# Patient Record
Sex: Female | Born: 1985 | Race: White | Hispanic: No | Marital: Married | Smoking: Never smoker
Health system: Southern US, Community
[De-identification: ages and names within clinical notes are randomized; demographics above are authoritative.]

## PROBLEM LIST (undated history)

## (undated) ENCOUNTER — Inpatient Hospital Stay (HOSPITAL_COMMUNITY): Payer: Self-pay

## (undated) DIAGNOSIS — R12 Heartburn: Secondary | ICD-10-CM

## (undated) DIAGNOSIS — N200 Calculus of kidney: Secondary | ICD-10-CM

## (undated) DIAGNOSIS — O26899 Other specified pregnancy related conditions, unspecified trimester: Secondary | ICD-10-CM

## (undated) HISTORY — PX: OTHER SURGICAL HISTORY: SHX169

## (undated) HISTORY — PX: ESOPHAGOGASTRODUODENOSCOPY ENDOSCOPY: SHX5814

---

## 2010-05-21 HISTORY — PX: WISDOM TOOTH EXTRACTION: SHX21

## 2011-10-08 ENCOUNTER — Inpatient Hospital Stay: Payer: Self-pay

## 2011-10-08 LAB — CBC WITH DIFFERENTIAL/PLATELET
Eosinophil #: 0.1 10*3/uL (ref 0.0–0.7)
Eosinophil %: 1.3 %
HCT: 36.9 % (ref 35.0–47.0)
HGB: 13.2 g/dL (ref 12.0–16.0)
Lymphocyte #: 1.9 10*3/uL (ref 1.0–3.6)
Lymphocyte %: 18.9 %
MCH: 36.2 pg — ABNORMAL HIGH (ref 26.0–34.0)
MCHC: 35.9 g/dL (ref 32.0–36.0)
MCV: 101 fL — ABNORMAL HIGH (ref 80–100)
Neutrophil #: 7.3 10*3/uL — ABNORMAL HIGH (ref 1.4–6.5)
Neutrophil %: 73 %
Platelet: 142 10*3/uL — ABNORMAL LOW (ref 150–440)
RDW: 12.9 % (ref 11.5–14.5)
WBC: 10 10*3/uL (ref 3.6–11.0)

## 2011-10-11 LAB — HEMATOCRIT: HCT: 26.1 % — ABNORMAL LOW (ref 35.0–47.0)

## 2012-03-26 ENCOUNTER — Emergency Department: Payer: Self-pay | Admitting: Emergency Medicine

## 2012-03-26 LAB — URINALYSIS, COMPLETE
Nitrite: NEGATIVE
Ph: 6 (ref 4.5–8.0)
Protein: 100
Specific Gravity: 1.027 (ref 1.003–1.030)
WBC UR: 3 /HPF (ref 0–5)

## 2012-03-26 LAB — CBC
HCT: 38.8 % (ref 35.0–47.0)
HGB: 13.9 g/dL (ref 12.0–16.0)
MCHC: 35.8 g/dL (ref 32.0–36.0)
RDW: 12.8 % (ref 11.5–14.5)
WBC: 15 10*3/uL — ABNORMAL HIGH (ref 3.6–11.0)

## 2012-03-26 LAB — BASIC METABOLIC PANEL
Chloride: 104 mmol/L (ref 98–107)
Creatinine: 0.79 mg/dL (ref 0.60–1.30)
EGFR (African American): >60
EGFR (Non-African Amer.): >60
Potassium: 3.6 mmol/L (ref 3.5–5.1)
Sodium: 139 mmol/L (ref 136–145)

## 2012-07-11 LAB — OB RESULTS CONSOLE RUBELLA ANTIBODY, IGM: Rubella: IMMUNE

## 2012-07-11 LAB — OB RESULTS CONSOLE HIV ANTIBODY (ROUTINE TESTING): HIV: NONREACTIVE

## 2012-07-11 LAB — OB RESULTS CONSOLE ABO/RH: RH Type: NEGATIVE

## 2012-08-20 ENCOUNTER — Inpatient Hospital Stay (HOSPITAL_COMMUNITY)
Admission: AD | Admit: 2012-08-20 | Discharge: 2012-08-20 | Disposition: A | Discharge status: Home / Self Care | Payer: 59 | Source: Ambulatory Visit | Attending: Obstetrics and Gynecology | Admitting: Obstetrics and Gynecology

## 2012-08-20 ENCOUNTER — Inpatient Hospital Stay (HOSPITAL_COMMUNITY): Payer: 59

## 2012-08-20 ENCOUNTER — Encounter (HOSPITAL_COMMUNITY): Payer: Self-pay | Admitting: *Deleted

## 2012-08-20 DIAGNOSIS — O321XX0 Maternal care for breech presentation, not applicable or unspecified: Secondary | ICD-10-CM | POA: Insufficient documentation

## 2012-08-20 DIAGNOSIS — O26859 Spotting complicating pregnancy, unspecified trimester: Secondary | ICD-10-CM | POA: Insufficient documentation

## 2012-08-20 DIAGNOSIS — Z2989 Encounter for other specified prophylactic measures: Secondary | ICD-10-CM | POA: Insufficient documentation

## 2012-08-20 DIAGNOSIS — Z298 Encounter for other specified prophylactic measures: Secondary | ICD-10-CM | POA: Insufficient documentation

## 2012-08-20 HISTORY — DX: Calculus of kidney: N20.0

## 2012-08-20 LAB — URINALYSIS, ROUTINE W REFLEX MICROSCOPIC
Bilirubin Urine: NEGATIVE
Glucose, UA: NEGATIVE mg/dL
Nitrite: NEGATIVE
Specific Gravity, Urine: 1.01 (ref 1.005–1.030)
pH: 7 (ref 5.0–8.0)

## 2012-08-20 LAB — URINE MICROSCOPIC-ADD ON

## 2012-08-20 MED ORDER — RHO D IMMUNE GLOBULIN 1500 UNIT/2ML IJ SOLN
300.0000 ug | Freq: Once | INTRAMUSCULAR | Status: AC
  Administered 2012-08-20: 300 ug via INTRAMUSCULAR
  Filled 2012-08-20: qty 2

## 2012-08-20 NOTE — MAU Note (Signed)
Pt given information on rhophylac
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
## 2012-08-20 NOTE — MAU Note (Signed)
Pt reports spotting this am. Denies pain.

## 2012-08-21 LAB — RH IG WORKUP (INCLUDES ABO/RH)
ABO/RH(D): A NEG
Antibody Screen: NEGATIVE
Gestational Age(Wks): 14.1

## 2012-10-29 LAB — PATHOLOGY REPORT

## 2012-12-07 LAB — OB RESULTS CONSOLE RPR: RPR: NONREACTIVE

## 2013-01-14 ENCOUNTER — Other Ambulatory Visit: Payer: Self-pay | Admitting: Obstetrics and Gynecology

## 2013-01-22 ENCOUNTER — Encounter (HOSPITAL_COMMUNITY): Payer: Self-pay | Admitting: Pharmacist

## 2013-02-06 ENCOUNTER — Encounter (HOSPITAL_COMMUNITY): Payer: Self-pay

## 2013-02-09 ENCOUNTER — Encounter (HOSPITAL_COMMUNITY): Payer: Self-pay

## 2013-02-09 ENCOUNTER — Encounter (HOSPITAL_COMMUNITY)
Admission: RE | Admit: 2013-02-09 | Discharge: 2013-02-09 | Disposition: A | Discharge status: Home / Self Care | Payer: 59 | Source: Ambulatory Visit | Attending: Obstetrics and Gynecology | Admitting: Obstetrics and Gynecology

## 2013-02-09 HISTORY — DX: Other specified pregnancy related conditions, unspecified trimester: O26.899

## 2013-02-09 HISTORY — DX: Heartburn: R12

## 2013-02-09 LAB — CBC
Hemoglobin: 13 g/dL (ref 12.0–15.0)
MCH: 34.5 pg — ABNORMAL HIGH (ref 26.0–34.0)
MCHC: 35 g/dL (ref 30.0–36.0)
RDW: 13.3 % (ref 11.5–15.5)

## 2013-02-09 LAB — RPR: RPR Ser Ql: NONREACTIVE

## 2013-02-09 NOTE — Pre-Procedure Instructions (Signed)
Informed Dr Brayton Caves of patient's low platelet count of 123 at PAT appt.  Draw STAT platelet count on DOS.  Verified Order.

## 2013-02-09 NOTE — H&P (Signed)
NAMESEKAI, NAYAK NO.:  [id number]  MEDICAL RECORD NO.:  [id number]  LOCATION:  [facility]                           FACILITY:  [facility]  PHYSICIAN:  Lenoard Aden, M.D.DATE OF BIRTH:  03-21-1986  DATE OF ADMISSION:  02/10/2013 DATE OF DISCHARGE:  02/13/2013                             HISTORY & PHYSICAL   CHIEF COMPLAINT:  Previous C-section for repeat.  HISTORY OF PRESENT ILLNESS:  She is a 27 year old white female G2, P0, who presents now at 76 weeks with previous C-section for repeat cesarean section.  MEDICATIONS:  To include prenatal vitamins.  ALLERGIES:  She has no known drug allergies.  SOCIAL HISTORY:  She is a nonsmoker, nondrinker.  Denies domestic or physical violence.  Her previous obstetric history is remarkable for a C-section for a fetal distress in 2013 of a 9 pounds 5 ounce fetus which due to difficulties at time of C-section suffered from a neonatal demise.  FAMILY HISTORY:  She has a family history of cleft palate, cervical cancer, and breast cancer.  Prenatal course has been uncomplicated to date.  PREVIOUS SURGICAL HISTORY:  Remarkable for C-section as noted.  PHYSICAL EXAMINATION:  GENERAL:  She is a well-developed, well- nourished, white female, in no acute distress. HEENT:  Normal. NECK:  Supple.  Full range of motion. LUNGS:  Clear. HEART:  Regular rhythm. ABDOMEN:  Soft, gravid, nontender.  Estimated fetal weight 8 pounds. Cervix is closed, 60%, vertex, -1. EXTREMITIES:  There are no cords. NEUROLOGIC:  Nonfocal. SKIN:  Intact.  IMPRESSION: 1. A 39-week intrauterine pregnancy. 2. Previous neonatal demise. 3. Previous C-section for repeat.  PLAN:  Proceed with repeat low segment transverse cesarean section. Risks of anesthesia, infection, bleeding, injury to surrounding organs, need for repair was discussed.  Delayed versus immediate complications to include bowel and bladder injury noted.  Patient acknowledges  and wishes to proceed.     Lenoard Aden, M.D.     RJT/MEDQ  D:  02/09/2013  T:  02/09/2013  Job:  [id number]

## 2013-02-09 NOTE — Patient Instructions (Addendum)
   Your procedure is scheduled on:  Tuesday, 02/10/13  Enter through the Main Entrance of [facility] at:  1130 am Pick up the phone at the desk and dial [phone] and inform us of your arrival.  Please call this number if you have any problems the morning of surgery: [phone]  Remember: Do not eat food after midnight: Monday Do not drink clear liquids after: 9 am Tuesday day of surgery Take these medicines the morning of surgery with a SIP OF WATER:  None  Do not wear jewelry, make-up, or FINGER nail polish No metal in your hair or on your body. Do not wear lotions, powders, perfumes. You may wear deodorant.  Please use your CHG wash as directed prior to surgery.  Do not shave anywhere for at least 12 hours prior to first CHG shower.  Do not bring valuables to the hospital. Contacts, dentures or bridgework may not be worn into surgery.  Leave suitcase in the car. After Surgery it may be brought to your room. For patients being admitted to the hospital, checkout time is 11:00am the day of discharge.  Home with Husband Harvie Heck.

## 2013-02-09 NOTE — Pre-Procedure Instructions (Signed)
Patient is breast feeding during hospital stay.

## 2013-02-09 NOTE — H&P (Deleted)
NAME:  Monacelli, Saydi                  ACCOUNT NO.:  [id number]  MEDICAL RECORD NO.:  [id number]  LOCATION:  [facility]                           FACILITY:  [facility]  PHYSICIAN:  Ailish Prospero J. Ivalee Strauser, M.D.DATE OF BIRTH:  03/30/1986  DATE OF ADMISSION:  02/10/2013 DATE OF DISCHARGE:  02/13/2013                             HISTORY & PHYSICAL   CHIEF COMPLAINT:  Previous C-section for repeat.  HISTORY OF PRESENT ILLNESS:  She is a 26-year-old white female G2, P0, who presents now at 39 weeks with previous C-section for repeat cesarean section.  MEDICATIONS:  To include prenatal vitamins.  ALLERGIES:  She has no known drug allergies.  SOCIAL HISTORY:  She is a nonsmoker, nondrinker.  Denies domestic or physical violence.  Her previous obstetric history is remarkable for a C-section for a fetal distress in 2013 of a 9 pounds 5 ounce fetus which due to difficulties at time of C-section suffered from a neonatal demise.  FAMILY HISTORY:  She has a family history of cleft palate, cervical cancer, and breast cancer.  Prenatal course has been uncomplicated to date.  PREVIOUS SURGICAL HISTORY:  Remarkable for C-section as noted.  PHYSICAL EXAMINATION:  GENERAL:  She is a well-developed, well- nourished, white female, in no acute distress. HEENT:  Normal. NECK:  Supple.  Full range of motion. LUNGS:  Clear. HEART:  Regular rhythm. ABDOMEN:  Soft, gravid, nontender.  Estimated fetal weight 8 pounds. Cervix is closed, 60%, vertex, -1. EXTREMITIES:  There are no cords. NEUROLOGIC:  Nonfocal. SKIN:  Intact.  IMPRESSION: 1. A 39-week intrauterine pregnancy. 2. Previous neonatal demise. 3. Previous C-section for repeat.  PLAN:  Proceed with repeat low segment transverse cesarean section. Risks of anesthesia, infection, bleeding, injury to surrounding organs, need for repair was discussed.  Delayed versus immediate complications to include bowel and bladder injury noted.  Patient acknowledges  and wishes to proceed.     Flem Enderle J. Mycheal Veldhuizen, M.D.     RJT/MEDQ  D:  02/09/2013  T:  02/09/2013  Job:  [id number] 

## 2013-02-10 ENCOUNTER — Encounter (HOSPITAL_COMMUNITY): Payer: Self-pay | Admitting: *Deleted

## 2013-02-10 ENCOUNTER — Encounter (HOSPITAL_COMMUNITY)
Admission: RE | Disposition: A | Discharge status: Home / Self Care | Payer: Self-pay | Source: Ambulatory Visit | Attending: Obstetrics and Gynecology

## 2013-02-10 ENCOUNTER — Inpatient Hospital Stay (HOSPITAL_COMMUNITY): Payer: 59 | Admitting: Anesthesiology

## 2013-02-10 ENCOUNTER — Inpatient Hospital Stay (HOSPITAL_COMMUNITY)
Admission: RE | Admit: 2013-02-10 | Discharge: 2013-02-13 | DRG: 765 | Disposition: A | Discharge status: Home / Self Care | Payer: 59 | Source: Ambulatory Visit | Attending: Obstetrics and Gynecology | Admitting: Obstetrics and Gynecology

## 2013-02-10 ENCOUNTER — Encounter (HOSPITAL_COMMUNITY): Payer: Self-pay | Admitting: Anesthesiology

## 2013-02-10 DIAGNOSIS — Z22330 Carrier of Group B streptococcus: Secondary | ICD-10-CM

## 2013-02-10 DIAGNOSIS — O99892 Other specified diseases and conditions complicating childbirth: Secondary | ICD-10-CM | POA: Diagnosis present

## 2013-02-10 DIAGNOSIS — D696 Thrombocytopenia, unspecified: Secondary | ICD-10-CM | POA: Diagnosis not present

## 2013-02-10 DIAGNOSIS — O34219 Maternal care for unspecified type scar from previous cesarean delivery: Principal | ICD-10-CM | POA: Diagnosis present

## 2013-02-10 DIAGNOSIS — D689 Coagulation defect, unspecified: Secondary | ICD-10-CM | POA: Diagnosis not present

## 2013-02-10 LAB — PLATELET COUNT: Platelets: 120 10*3/uL — ABNORMAL LOW (ref 150–400)

## 2013-02-10 SURGERY — Surgical Case
Anesthesia: Spinal | Site: Abdomen | Wound class: Clean Contaminated

## 2013-02-10 MED ORDER — KETOROLAC TROMETHAMINE 30 MG/ML IJ SOLN: 30.0000 mg | Freq: Four times a day (QID) | INTRAMUSCULAR | Status: AC | PRN

## 2013-02-10 MED ORDER — OXYTOCIN 10 UNIT/ML IJ SOLN
40.0000 [IU] | INTRAVENOUS | Status: DC | PRN
  Administered 2013-02-10: 40 [IU] via INTRAVENOUS

## 2013-02-10 MED ORDER — KETOROLAC TROMETHAMINE 60 MG/2ML IM SOLN
60.0000 mg | Freq: Once | INTRAMUSCULAR | Status: AC | PRN
  Administered 2013-02-10: 60 mg via INTRAMUSCULAR

## 2013-02-10 MED ORDER — MEPERIDINE HCL 25 MG/ML IJ SOLN: 6.2500 mg | INTRAMUSCULAR | Status: DC | PRN

## 2013-02-10 MED ORDER — SIMETHICONE 80 MG PO CHEW
80.0000 mg | CHEWABLE_TABLET | ORAL | Status: DC
  Administered 2013-02-10 – 2013-02-12 (×3): 80 mg via ORAL

## 2013-02-10 MED ORDER — FENTANYL CITRATE 0.05 MG/ML IJ SOLN
INTRAMUSCULAR | Status: AC
  Filled 2013-02-10: qty 2

## 2013-02-10 MED ORDER — ONDANSETRON HCL 4 MG/2ML IJ SOLN
INTRAMUSCULAR | Status: DC | PRN
  Administered 2013-02-10: 4 mg via INTRAVENOUS

## 2013-02-10 MED ORDER — ONDANSETRON HCL 4 MG/2ML IJ SOLN
INTRAMUSCULAR | Status: AC
  Filled 2013-02-10: qty 2

## 2013-02-10 MED ORDER — IBUPROFEN 600 MG PO TABS
600.0000 mg | ORAL_TABLET | Freq: Four times a day (QID) | ORAL | Status: DC
  Administered 2013-02-10 – 2013-02-13 (×11): 600 mg via ORAL
  Filled 2013-02-10 (×11): qty 1

## 2013-02-10 MED ORDER — NALBUPHINE SYRINGE 5 MG/0.5 ML
5.0000 mg | INJECTION | INTRAMUSCULAR | Status: DC | PRN
  Filled 2013-02-10: qty 1

## 2013-02-10 MED ORDER — ONDANSETRON HCL 4 MG PO TABS: 4.0000 mg | ORAL_TABLET | ORAL | Status: DC | PRN

## 2013-02-10 MED ORDER — PHENYLEPHRINE 40 MCG/ML (10ML) SYRINGE FOR IV PUSH (FOR BLOOD PRESSURE SUPPORT)
PREFILLED_SYRINGE | INTRAVENOUS | Status: AC
  Filled 2013-02-10: qty 5

## 2013-02-10 MED ORDER — DIPHENHYDRAMINE HCL 25 MG PO CAPS: 25.0000 mg | ORAL_CAPSULE | Freq: Four times a day (QID) | ORAL | Status: DC | PRN

## 2013-02-10 MED ORDER — DIBUCAINE 1 % RE OINT: 1.0000 "application " | TOPICAL_OINTMENT | RECTAL | Status: DC | PRN

## 2013-02-10 MED ORDER — LACTATED RINGERS IV SOLN
INTRAVENOUS | Status: DC
  Administered 2013-02-11: 01:00:00 via INTRAVENOUS

## 2013-02-10 MED ORDER — METHYLERGONOVINE MALEATE 0.2 MG/ML IJ SOLN: 0.2000 mg | INTRAMUSCULAR | Status: DC | PRN

## 2013-02-10 MED ORDER — CEFAZOLIN SODIUM-DEXTROSE 2-3 GM-% IV SOLR
INTRAVENOUS | Status: AC
  Filled 2013-02-10: qty 50

## 2013-02-10 MED ORDER — OXYTOCIN 10 UNIT/ML IJ SOLN
INTRAMUSCULAR | Status: AC
  Filled 2013-02-10: qty 4

## 2013-02-10 MED ORDER — PRENATAL MULTIVITAMIN CH
1.0000 | ORAL_TABLET | Freq: Every day | ORAL | Status: DC
  Administered 2013-02-11 – 2013-02-13 (×3): 1 via ORAL
  Filled 2013-02-10 (×3): qty 1

## 2013-02-10 MED ORDER — ONDANSETRON HCL 4 MG/2ML IJ SOLN: 4.0000 mg | INTRAMUSCULAR | Status: DC | PRN

## 2013-02-10 MED ORDER — LACTATED RINGERS IV SOLN
INTRAVENOUS | Status: DC
  Administered 2013-02-10 (×3): via INTRAVENOUS

## 2013-02-10 MED ORDER — FENTANYL CITRATE 0.05 MG/ML IJ SOLN: 25.0000 ug | INTRAMUSCULAR | Status: DC | PRN

## 2013-02-10 MED ORDER — CEFAZOLIN SODIUM-DEXTROSE 2-3 GM-% IV SOLR
2.0000 g | INTRAVENOUS | Status: AC
  Administered 2013-02-10: 2 g via INTRAVENOUS

## 2013-02-10 MED ORDER — SODIUM CHLORIDE 0.9 % IJ SOLN: 3.0000 mL | INTRAMUSCULAR | Status: DC | PRN

## 2013-02-10 MED ORDER — NALOXONE HCL 0.4 MG/ML IJ SOLN: 0.4000 mg | INTRAMUSCULAR | Status: DC | PRN

## 2013-02-10 MED ORDER — DIPHENHYDRAMINE HCL 50 MG/ML IJ SOLN: 25.0000 mg | INTRAMUSCULAR | Status: DC | PRN

## 2013-02-10 MED ORDER — BUPIVACAINE HCL (PF) 0.25 % IJ SOLN
INTRAMUSCULAR | Status: AC
  Filled 2013-02-10: qty 30

## 2013-02-10 MED ORDER — SIMETHICONE 80 MG PO CHEW: 80.0000 mg | CHEWABLE_TABLET | ORAL | Status: DC | PRN

## 2013-02-10 MED ORDER — BUPIVACAINE HCL (PF) 0.25 % IJ SOLN
INTRAMUSCULAR | Status: DC | PRN
  Administered 2013-02-10: 10 mL

## 2013-02-10 MED ORDER — OXYCODONE-ACETAMINOPHEN 5-325 MG PO TABS
1.0000 | ORAL_TABLET | ORAL | Status: DC | PRN
  Administered 2013-02-11: 1 via ORAL
  Filled 2013-02-10: qty 1

## 2013-02-10 MED ORDER — FENTANYL CITRATE 0.05 MG/ML IJ SOLN
INTRAMUSCULAR | Status: DC | PRN
  Administered 2013-02-10: 12.5 ug via INTRATHECAL

## 2013-02-10 MED ORDER — LANOLIN HYDROUS EX OINT: 1.0000 "application " | TOPICAL_OINTMENT | CUTANEOUS | Status: DC | PRN

## 2013-02-10 MED ORDER — TETANUS-DIPHTH-ACELL PERTUSSIS 5-2.5-18.5 LF-MCG/0.5 IM SUSP: 0.5000 mL | Freq: Once | INTRAMUSCULAR | Status: DC

## 2013-02-10 MED ORDER — SCOPOLAMINE 1 MG/3DAYS TD PT72: 1.0000 | MEDICATED_PATCH | Freq: Once | TRANSDERMAL | Status: DC

## 2013-02-10 MED ORDER — ONDANSETRON HCL 4 MG/2ML IJ SOLN: 4.0000 mg | Freq: Three times a day (TID) | INTRAMUSCULAR | Status: DC | PRN

## 2013-02-10 MED ORDER — SENNOSIDES-DOCUSATE SODIUM 8.6-50 MG PO TABS
2.0000 | ORAL_TABLET | ORAL | Status: DC
  Administered 2013-02-10 – 2013-02-12 (×3): 2 via ORAL

## 2013-02-10 MED ORDER — BUPIVACAINE IN DEXTROSE 0.75-8.25 % IT SOLN
INTRATHECAL | Status: DC | PRN
  Administered 2013-02-10: 1.4 mL via INTRATHECAL

## 2013-02-10 MED ORDER — KETOROLAC TROMETHAMINE 30 MG/ML IJ SOLN: 15.0000 mg | Freq: Once | INTRAMUSCULAR | Status: DC | PRN

## 2013-02-10 MED ORDER — PHENYLEPHRINE HCL 10 MG/ML IJ SOLN
10.0000 mg | INTRAVENOUS | Status: DC | PRN
  Administered 2013-02-10: 50 ug/min via INTRAVENOUS

## 2013-02-10 MED ORDER — DIPHENHYDRAMINE HCL 25 MG PO CAPS: 25.0000 mg | ORAL_CAPSULE | ORAL | Status: DC | PRN

## 2013-02-10 MED ORDER — MORPHINE SULFATE (PF) 0.5 MG/ML IJ SOLN
INTRAMUSCULAR | Status: DC | PRN
  Administered 2013-02-10: .1 mg via INTRATHECAL

## 2013-02-10 MED ORDER — DIPHENHYDRAMINE HCL 50 MG/ML IJ SOLN: 12.5000 mg | INTRAMUSCULAR | Status: DC | PRN

## 2013-02-10 MED ORDER — WITCH HAZEL-GLYCERIN EX PADS: 1.0000 "application " | MEDICATED_PAD | CUTANEOUS | Status: DC | PRN

## 2013-02-10 MED ORDER — NALOXONE HCL 1 MG/ML IJ SOLN: 1.0000 ug/kg/h | INTRAMUSCULAR | Status: DC | PRN

## 2013-02-10 MED ORDER — MENTHOL 3 MG MT LOZG: 1.0000 | LOZENGE | OROMUCOSAL | Status: DC | PRN

## 2013-02-10 MED ORDER — PROMETHAZINE HCL 25 MG/ML IJ SOLN: 6.2500 mg | INTRAMUSCULAR | Status: DC | PRN

## 2013-02-10 MED ORDER — SCOPOLAMINE 1 MG/3DAYS TD PT72
MEDICATED_PATCH | TRANSDERMAL | Status: AC
  Administered 2013-02-10: 1.5 mg via TRANSDERMAL
  Filled 2013-02-10: qty 1

## 2013-02-10 MED ORDER — LACTATED RINGERS IV SOLN
Freq: Once | INTRAVENOUS | Status: AC
  Administered 2013-02-10: 12:00:00 via INTRAVENOUS

## 2013-02-10 MED ORDER — MORPHINE SULFATE 0.5 MG/ML IJ SOLN
INTRAMUSCULAR | Status: AC
  Filled 2013-02-10: qty 10

## 2013-02-10 MED ORDER — OXYTOCIN 40 UNITS IN LACTATED RINGERS INFUSION - SIMPLE MED: 62.5000 mL/h | INTRAVENOUS | Status: AC

## 2013-02-10 MED ORDER — METHYLERGONOVINE MALEATE 0.2 MG PO TABS
0.2000 mg | ORAL_TABLET | ORAL | Status: DC | PRN
Start: 2013-02-10 — End: 2013-02-13

## 2013-02-10 MED ORDER — KETOROLAC TROMETHAMINE 60 MG/2ML IM SOLN
INTRAMUSCULAR | Status: AC
  Administered 2013-02-10: 60 mg via INTRAMUSCULAR
  Filled 2013-02-10: qty 2

## 2013-02-10 MED ORDER — ZOLPIDEM TARTRATE 5 MG PO TABS: 5.0000 mg | ORAL_TABLET | Freq: Every evening | ORAL | Status: DC | PRN

## 2013-02-10 MED ORDER — METOCLOPRAMIDE HCL 5 MG/ML IJ SOLN: 10.0000 mg | Freq: Three times a day (TID) | INTRAMUSCULAR | Status: DC | PRN

## 2013-02-10 MED ORDER — SIMETHICONE 80 MG PO CHEW
80.0000 mg | CHEWABLE_TABLET | Freq: Three times a day (TID) | ORAL | Status: DC
  Administered 2013-02-11 – 2013-02-13 (×6): 80 mg via ORAL

## 2013-02-10 SURGICAL SUPPLY — 33 items
CLAMP CORD UMBIL (MISCELLANEOUS) IMPLANT
CLEANER TIP ELECTROSURG 2X2 (MISCELLANEOUS) ×2 IMPLANT
CLOTH BEACON ORANGE TIMEOUT ST (SAFETY) ×2 IMPLANT
CONTAINER PREFILL 10% NBF 15ML (MISCELLANEOUS) IMPLANT
DEVICE BLD TRNS LUER ATTCH (MISCELLANEOUS) ×2 IMPLANT
DRAPE LG THREE QUARTER DISP (DRAPES) ×4 IMPLANT
DRSG OPSITE POSTOP 4X10 (GAUZE/BANDAGES/DRESSINGS) ×2 IMPLANT
DURAPREP 26ML APPLICATOR (WOUND CARE) ×2 IMPLANT
ELECT REM PT RETURN 9FT ADLT (ELECTROSURGICAL) ×2
ELECTRODE REM PT RTRN 9FT ADLT (ELECTROSURGICAL) ×1 IMPLANT
EXTRACTOR VACUUM M CUP 4 TUBE (SUCTIONS) IMPLANT
GLOVE BIO SURGEON STRL SZ7.5 (GLOVE) ×2 IMPLANT
GOWN PREVENTION PLUS XLARGE (GOWN DISPOSABLE) ×2 IMPLANT
GOWN STRL REIN XL XLG (GOWN DISPOSABLE) ×2 IMPLANT
KIT ABG SYR 3ML LUER SLIP (SYRINGE) IMPLANT
NEEDLE HYPO 25X1 1.5 SAFETY (NEEDLE) ×2 IMPLANT
NEEDLE HYPO 25X5/8 SAFETYGLIDE (NEEDLE) IMPLANT
NS IRRIG 1000ML POUR BTL (IV SOLUTION) ×2 IMPLANT
PACK C SECTION WH (CUSTOM PROCEDURE TRAY) ×2 IMPLANT
PENCIL BUTTON HOLSTER BLD 10FT (ELECTRODE) ×2 IMPLANT
STAPLER VISISTAT 35W (STAPLE) IMPLANT
SUT MNCRL 0 VIOLET CTX 36 (SUTURE) ×2 IMPLANT
SUT MON AB 2-0 CT1 27 (SUTURE) ×2 IMPLANT
SUT MON AB-0 CT1 36 (SUTURE) ×4 IMPLANT
SUT MONOCRYL 0 CTX 36 (SUTURE) ×2
SUT PLAIN 0 NONE (SUTURE) IMPLANT
SUT PLAIN 2 0 XLH (SUTURE) IMPLANT
SUT PLAIN 3 0 XLH 27  52T (SUTURE) ×2 IMPLANT
SUT VIC AB 4-0 KS 27 (SUTURE) ×2 IMPLANT
SYR CONTROL 10ML LL (SYRINGE) ×2 IMPLANT
TOWEL OR 17X24 6PK STRL BLUE (TOWEL DISPOSABLE) ×2 IMPLANT
TRAY FOLEY CATH 14FR (SET/KITS/TRAYS/PACK) ×2 IMPLANT
WATER STERILE IRR 1000ML POUR (IV SOLUTION) IMPLANT

## 2013-02-10 NOTE — Anesthesia Preprocedure Evaluation (Signed)
Anesthesia Evaluation  Patient identified by MRN, date of birth, ID band Patient awake    Reviewed: Allergy & Precautions, H&P , NPO status , Patient's Chart, lab work & pertinent test results  Airway Mallampati: I TM Distance: >3 FB Neck ROM: full    Dental no notable dental hx.    Pulmonary neg pulmonary ROS,    Pulmonary exam normal       Cardiovascular negative cardio ROS      Neuro/Psych negative neurological ROS  negative psych ROS   GI/Hepatic negative GI ROS, Neg liver ROS,   Endo/Other  negative endocrine ROS  Renal/GU   negative genitourinary   Musculoskeletal negative musculoskeletal ROS (+)   Abdominal Normal abdominal exam  (+)   Peds  Hematology negative hematology ROS (+)   Anesthesia Other Findings   Reproductive/Obstetrics (+) Pregnancy                           Anesthesia Physical Anesthesia Plan  ASA: II  Anesthesia Plan: Spinal   Post-op Pain Management:    Induction:   Airway Management Planned:   Additional Equipment:   Intra-op Plan:   Post-operative Plan:   Informed Consent: I have reviewed the patients History and Physical, chart, labs and discussed the procedure including the risks, benefits and alternatives for the proposed anesthesia with the patient or authorized representative who has indicated his/her understanding and acceptance.     Plan Discussed with: CRNA and Surgeon  Anesthesia Plan Comments:         Anesthesia Quick Evaluation

## 2013-02-10 NOTE — Anesthesia Procedure Notes (Signed)
Spinal  Patient location during procedure: OR Start time: 02/10/2013 1:01 PM End time: 02/10/2013 1:06 PM Staffing Anesthesiologist: Sandrea Hughs Performed by: anesthesiologist  Preanesthetic Checklist Completed: patient identified, surgical consent, pre-op evaluation, timeout performed, IV checked, risks and benefits discussed and monitors and equipment checked Spinal Block Patient position: sitting Prep: DuraPrep Patient monitoring: heart rate, cardiac monitor, continuous pulse ox and blood pressure Approach: midline Location: L3-4 Injection technique: single-shot Needle Needle type: Sprotte  Needle gauge: 24 G Needle length: 9 cm Needle insertion depth: 5 cm Assessment Sensory level: T4

## 2013-02-10 NOTE — Progress Notes (Signed)
Patient ID: Paige Stafford, female   DOB: 1985-10-14, 27 y.o.   MRN: [id number] Patient seen and examined. Consent witnessed and signed. No changes noted. Update completed. CBC    Component Value Date/Time   WBC 8.0 02/09/2013 1135   RBC 3.77* 02/09/2013 1135   HGB 13.0 02/09/2013 1135   HCT 37.1 02/09/2013 1135   PLT 120* 02/10/2013 1149   MCV 98.4 02/09/2013 1135   MCH 34.5* 02/09/2013 1135   MCHC 35.0 02/09/2013 1135   RDW 13.3 02/09/2013 1135

## 2013-02-10 NOTE — Consult Note (Signed)
Neonatology Note:  Attendance at C-section:  I was asked by Dr. Taavon to attend this repeat C/S at term. The mother is a G2P1L0 A neg, GBS pos with an uncomplicated pregnancy. ROM at delivery, fluid clear. Infant vigorous with good spontaneous cry and tone. Needed only minimal bulb suctioning. Ap 8/9. Lungs clear to ausc in DR. To CN to care of Pediatrician.  Mamta Rimmer C. Jakyra Kenealy, MD  

## 2013-02-10 NOTE — Anesthesia Postprocedure Evaluation (Signed)
Anesthesia Post Note  Patient: Paige Stafford  Procedure(s) Performed: Procedure(s) (LRB): Repeat CESAREAN SECTION (N/A)  Anesthesia type: Spinal  Patient location: PACU  Post pain: Pain level controlled  Post assessment: Post-op Vital signs reviewed  Last Vitals:  Filed Vitals:   02/10/13 1120  BP: 129/66  Pulse: 83  Temp: 36.6 C  Resp: 16    Post vital signs: Reviewed  Level of consciousness: awake  Complications: No apparent anesthesia complications

## 2013-02-10 NOTE — Op Note (Signed)
Cesarean Section Procedure Note  Indications: previous uterine incision kerr x one  Pre-operative Diagnosis: 39 week 0 day pregnancy.  Post-operative Diagnosis: same  Surgeon: Lenoard Aden   Assistants: Arita Miss, CNM  Anesthesia: Spinal anesthesia  ASA Class: 2  Procedure Details  The patient was seen in the Holding Room. The risks, benefits, complications, treatment options, and expected outcomes were discussed with the patient.  The patient concurred with the proposed plan, giving informed consent. The risks of anesthesia, infection, bleeding and possible injury to other organs discussed. Injury to bowel, bladder, or ureter with possible need for repair discussed. Possible need for transfusion with secondary risks of hepatitis or HIV acquisition discussed. Post operative complications to include but not limited to DVT, PE and Pneumonia noted. The site of surgery properly noted/marked. The patient was taken to Operating Room # 9, identified as Paige Stafford and the procedure verified as C-Section Delivery. A Time Out was held and the above information confirmed.  After induction of anesthesia, the patient was draped and prepped in the usual sterile manner. A Pfannenstiel incision was made and carried down through the subcutaneous tissue to the fascia. Fascial incision was made and extended transversely using Mayo scissors. The fascia was separated from the underlying rectus tissue superiorly and inferiorly. The peritoneum was identified and entered. Peritoneal incision was extended longitudinally. The utero-vesical peritoneal reflection was incised transversely and the bladder flap was bluntly freed from the lower uterine segment. A low transverse uterine incision(Kerr hysterotomy) was made. Delivered from female presentation was a  vertex with Apgar scores of 9 at one minute and 9 at five minutes. Bulb suctioning gently performed. Neonatal team in attendance.After the umbilical cord was clamped and  cut cord blood was obtained for evaluation. The placenta was removed intact and appeared normal. The uterus was curetted with a dry lap pack. Good hemostasis was noted.The uterine outline, tubes and ovaries appeared normal. The uterine incision was closed with running locked sutures of 0 Monocryl x 2 layers. Hemostasis was observed.  The fascia was then reapproximated with running sutures of 0 Monocryl. 2-0 plain to close Coleman layer.The skin was reapproximated with 4-0 vicryl.  Instrument, sponge, and needle counts were correct prior the abdominal closure and at the conclusion of the case.   Findings: FTLM, posterior placenta , nl tubes, nl ovaries  Estimated Blood Loss:  300 mL         Drains: foley                 Specimens: placenta                 Complications:  None; patient tolerated the procedure well.         Disposition: PACU - hemodynamically stable.         Condition: stable  Attending Attestation: I performed the procedure.

## 2013-02-10 NOTE — Transfer of Care (Signed)
Immediate Anesthesia Transfer of Care Note  Patient: Paige Stafford  Procedure(s) Performed: Procedure(s) with comments: Repeat CESAREAN SECTION (N/A) - EDD: 02/17/13  Patient Location: PACU  Anesthesia Type:Spinal  Level of Consciousness: awake  Airway & Oxygen Therapy: Patient Spontanous Breathing  Post-op Assessment: Report given to PACU RN  Post vital signs: Reviewed and stable  Complications: No apparent anesthesia complications

## 2013-02-11 ENCOUNTER — Encounter (HOSPITAL_COMMUNITY): Payer: Self-pay | Admitting: Obstetrics and Gynecology

## 2013-02-11 LAB — CBC
HCT: 33.9 % — ABNORMAL LOW (ref 36.0–46.0)
Platelets: 100 10*3/uL — ABNORMAL LOW (ref 150–400)
RDW: 13.5 % (ref 11.5–15.5)
WBC: 10.7 10*3/uL — ABNORMAL HIGH (ref 4.0–10.5)

## 2013-02-11 LAB — BIRTH TISSUE RECOVERY COLLECTION (PLACENTA DONATION)

## 2013-02-11 MED ORDER — RHO D IMMUNE GLOBULIN 1500 UNIT/2ML IJ SOLN
300.0000 ug | Freq: Once | INTRAMUSCULAR | Status: AC
  Administered 2013-02-12: 300 ug via INTRAMUSCULAR
  Filled 2013-02-11: qty 2

## 2013-02-11 NOTE — Lactation Note (Addendum)
This note was copied from the chart of Paige Dula Havlik. Lactation Consultation Note     Initial consult with this mom of a term baby, in the NICU with tachypnea and R/O pneumonia. Mom has pumped 4 times already, and is expressing about 5 mls each time. I gave her the nicu book on EBM and the lactation folder, and reviewed both with mom. I showed mom how to hand express, and mom returned demonstrated with fair technique. I told mom I would help her with breast feeding , once the baby is ready. Mom has a DEP at home. Mom knows to cal for questions/concerns. I increased mom to 27 flanges.  Patient Name: Paige Stafford [id number] Date: 02/11/2013 Reason for consult: Initial assessment;NICU baby   Maternal Data Formula Feeding for Exclusion: Yes (baby in NICU) Infant to breast within first hour of birth: Yes Has patient been taught Hand Expression?: Yes Does the patient have breastfeeding experience prior to this delivery?: No  Feeding Feeding Type: Breast Milk with Formula added Length of feed: 30 min  LATCH Score/Interventions                      Lactation Tools Discussed/Used Tools: Pump WIC Program: No Pump Review: Setup, frequency, and cleaning;Milk Storage;Other (comment) (hand expression, teaching on providing EBM in NICU) Initiated by:: bedside RN within 6 hours of delivery Date initiated:: 02/11/13   Consult Status Consult Status: Follow-up Date: 02/12/13 Follow-up type: In-patient    Alfred Levins 02/11/2013, 2:50 PM

## 2013-02-11 NOTE — Progress Notes (Signed)
POD # 1  Subjective: Pt reports feeling good/ Pain controlled with Motrin Tolerating po/Voiding without problems/ No n/v/ Flatus present Activity: ad lib Bleeding is light Newborn info:  Information for the patient's newborn:  Helmi, Hechavarria Savanha [[id number]]  female  / Circumcision: planning-baby in NICU/ Feeding: breast, pumping   Objective:  VS:  Filed Vitals:   02/10/13 2122 02/10/13 2322 02/11/13 0110 02/11/13 0515  BP: 93/61 116/70 105/67 99/64  Pulse: 84 62 63 63  Temp:  97.9 F (36.6 C) 98.3 F (36.8 C) 98 F (36.7 C)  TempSrc:  Oral Oral Oral  Resp: 16 18 16 18   Weight:      SpO2:  96% 96% 98%     I&O: Intake/Output     09/23 0701 - 09/24 0700 09/24 0701 - 09/25 0700   P.O. 720    I.V. (mL/kg) 2400 (30.1)    Total Intake(mL/kg) 3120 (39.1)    Urine (mL/kg/hr) 2300    Blood 900    Total Output 3200     Net -80             Recent Labs  02/09/13 1135 02/10/13 1149 02/11/13 0748  WBC 8.0  --  10.7*  HGB 13.0  --  12.1  HCT 37.1  --  33.9*  PLT 123* 120* 100*    Blood type: --/--/A NEG (09/24 0748) Rubella: Immune (02/21 0000)    Physical Exam:  General: alert and cooperative CV: Regular rate and rhythm Resp: clear Abdomen: soft, nontender, normal bowel sounds Incision: healing well, no drainage, no erythema, no hernia, no seroma, no swelling, well approximated, honeycomb dressing intact Uterine Fundus: firm, below umbilicus, nontender Lochia: minimal Ext: extremities normal, atraumatic, no cyanosis or edema and Homans sign is negative, no sign of DVT    Assessment: POD # 1/ G2P2001/ S/P C/Section d/t repeat Thrombocytopenia Doing well  Plan: Ambulate Continue routine post op orders Recheck platelet count in am   Signed: Donette Larry, Dorris Carnes, MSN, CNM 02/11/2013, 12:45 PM

## 2013-02-11 NOTE — Anesthesia Postprocedure Evaluation (Signed)
  Anesthesia Post-op Note  Patient: Paige Stafford  Procedure(s) Performed: Procedure(s) with comments: Repeat CESAREAN SECTION (N/A) - EDD: 02/17/13  Patient Location: PACU and Mother/Baby  Anesthesia Type:Spinal  Level of Consciousness: awake, alert  and oriented  Airway and Oxygen Therapy: Patient Spontanous Breathing  Post-op Pain: mild  Post-op Assessment: Patient's Cardiovascular Status Stable, Respiratory Function Stable, No signs of Nausea or vomiting and Pain level controlled  Post-op Vital Signs: stable

## 2013-02-12 LAB — PLATELET COUNT: Platelets: 107 10*3/uL — ABNORMAL LOW (ref 150–400)

## 2013-02-12 NOTE — Progress Notes (Signed)
POSTOPERATIVE DAY # 2 S/P CS   S:         Reports feeling well             Tolerating po intake / no nausea / no vomiting / + flatus / no BM             Bleeding is light             Pain controlled with motrin and occasional percocet             Up ad lib / ambulatory/ voiding QS  Newborn breast feeding     O:  VS: BP 118/72  Pulse 66  Temp(Src) 98.3 F (36.8 C) (Oral)  Resp 18  Wt 79.833 kg (176 lb)  BMI 32.18 kg/m2  SpO2 98%  LMP 05/13/2012   LABS:               Recent Labs  02/11/13 0748 02/12/13 0641  WBC 10.7*  --   HGB 12.1  --   PLT 100* 107*               Bloodtype: --/--/A NEG (09/24 1308)  Rubella: Immune (02/21 0000)                                             Physical Exam:             Alert and Oriented X3  Lungs: Clear and unlabored  Heart: regular rate and rhythm / no mumurs  Abdomen: soft, non-tender, non-distended active BS             Fundus: firm, non-tender, U-1             Dressing intact honeycomb              Incision:   no erythema / no ecchymosis / no drainage  Perineum: no edema  Lochia: light  Extremities: trace edema, no calf pain or tenderness, negative Homans  A:        POD # 2 S/P CS              P:        Routine postoperative care              Anticipate DC tomorrow     Marlinda Mike CNM, MSN, [facility] 02/12/2013, 1:02 PM

## 2013-02-13 LAB — TYPE AND SCREEN
DAT, IgG: NEGATIVE
Unit division: 0

## 2013-02-13 LAB — RH IG WORKUP (INCLUDES ABO/RH)
ABO/RH(D): A NEG
Fetal Screen: NEGATIVE
Gestational Age(Wks): 39
Unit division: 0

## 2013-02-13 MED ORDER — IBUPROFEN 600 MG PO TABS: 600.0000 mg | ORAL_TABLET | Freq: Four times a day (QID) | ORAL | Status: DC

## 2013-02-13 MED ORDER — OXYCODONE-ACETAMINOPHEN 5-325 MG PO TABS: 1.0000 | ORAL_TABLET | ORAL | Status: DC | PRN

## 2013-02-13 NOTE — Discharge Summary (Signed)
Obstetric Discharge Summary Reason for Admission: Repeat Cesarean Delivery Prenatal Procedures: ultrasound Intrapartum Procedures: cesarean: low cervical, transverse Postpartum Procedures: none Complications-Operative and Postpartum: none Hemoglobin  Date Value Range Status  02/11/2013 12.1  12.0 - 15.0 g/dL Final     HCT  Date Value Range Status  02/11/2013 33.9* 36.0 - 46.0 % Final    Physical Exam:  General: alert, cooperative and no distress Lochia: appropriate Uterine Fundus: firm, midline, U-3 Incision: Tegaderm and Honeycomb removed - Dermabond and sutures intact DVT Evaluation: No evidence of DVT seen on physical exam. Negative Homan's sign. No cords or calf tenderness. No significant calf/ankle edema.  Discharge Diagnoses: Repeat Cesarean Delivery  Discharge Information: Date: 02/13/2013 Activity: pelvic rest and no driving x 2 weeks Diet: routine Medications: PNV, Ibuprofen and Percocet Condition: stable Instructions: refer to practice specific booklet Discharge to: home Follow-up Information   Follow up with Lenoard Aden, MD. Schedule an appointment as soon as possible for a visit in 6 weeks.   Specialty:  Obstetrics and Gynecology   Contact information:   [address] [place] [address] [phone]       Newborn Data: Live born female on 02/10/2012 Birth Weight: 8 lb 5.3 oz (3780 g) APGAR: 8, 9  Infant in NICU  Kenard Gower, MSN, CNM 02/13/2013, 1:36 PM

## 2013-02-13 NOTE — Progress Notes (Signed)
Patient ID: Paige Stafford, female   DOB: [birth year], 27 y.o.   MRN: [id number] Subjective: POD# 3 Information for the patient's newborn:  Paige Stafford, Paige Stafford [[id number]]  female  / circ baby in NICU  Reports feeling well Feeding: bottle - pumped breast milk Patient reports tolerating PO.  Breast symptoms: none Pain controlled with ibuprofen (OTC) and narcotic analgesics including Percocet Denies HA/SOB/C/P/N/V/dizziness. Flatus present, (+) BM. She reports vaginal bleeding as normal, without clots.  She is ambulating, urinating without difficult.     Objective:   VS:  Filed Vitals:   02/11/13 1845 02/12/13 0520 02/12/13 1817 02/13/13 0551  BP: 113/73 118/72 115/70 121/78  Pulse: 54 66 73 64  Temp: 97.7 F (36.5 C) 98.3 F (36.8 C) 98 F (36.7 C) 98.7 F (37.1 C)  TempSrc: Oral Oral Oral Oral  Resp: 18 18 20 18   Weight:      SpO2:          Recent Labs  02/11/13 0748 02/12/13 0641  WBC 10.7*  --   HGB 12.1  --   HCT 33.9*  --   PLT 100* 107*     Blood type: --/--/A NEG (09/24 0748)  Rubella: Immune (02/21 0000)     Physical Exam:  General: alert, cooperative and no distress CV: Regular rate and rhythm, S1S2 present or without murmur or extra heart sounds Resp: clear Abdomen: soft, nontender, normal bowel sounds Incision: Tegaderm and Honeycomb removed - Dermabond and sutures intact Uterine Fundus: firm, below umbilicus, nontender Lochia: minimal Ext: extremities normal, atraumatic, no cyanosis or edema, Homans sign is negative, no sign of DVT and no edema, redness or tenderness in the calves or thighs      Assessment/Plan: 27 y.o.   POD# 3.  s/p Cesarean Delivery.  Indications: Repeat Cesarean Delivery                Principal Problem:   Postpartum care following cesarean delivery (9/23)  Doing well, stable.               Regular diet as tolerated Ambulate Routine post-op care D/C today  Kenard Gower, MSN, CNM 02/13/2013, 1:31 PM

## 2014-03-22 ENCOUNTER — Encounter (HOSPITAL_COMMUNITY): Payer: Self-pay | Admitting: Obstetrics and Gynecology

## 2014-06-04 IMAGING — US US RENAL KIDNEY
1 series · 14 of 25 positions shown · non-contrast
Comparison: none

REASON FOR EXAM: pain - R flank, back - hematuria
COMMENTS:

[Series 1: us renal kidney · 0.28mm/px · 14 of 45 slices shown]
[im 1/45]
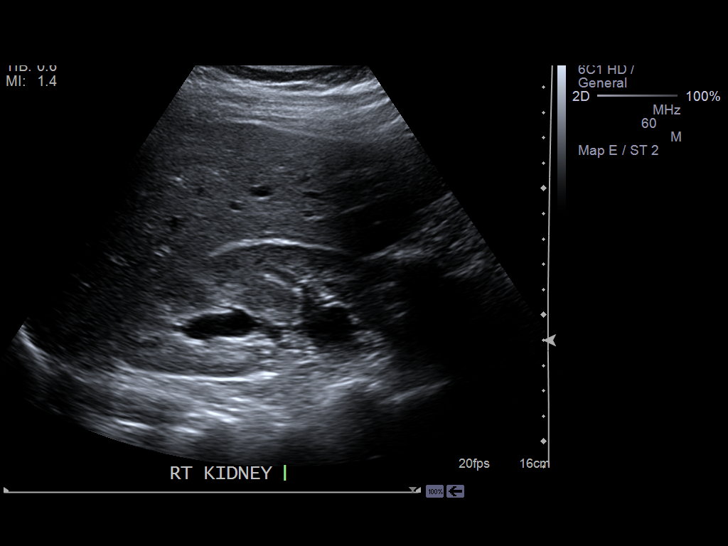
[im 4/45]
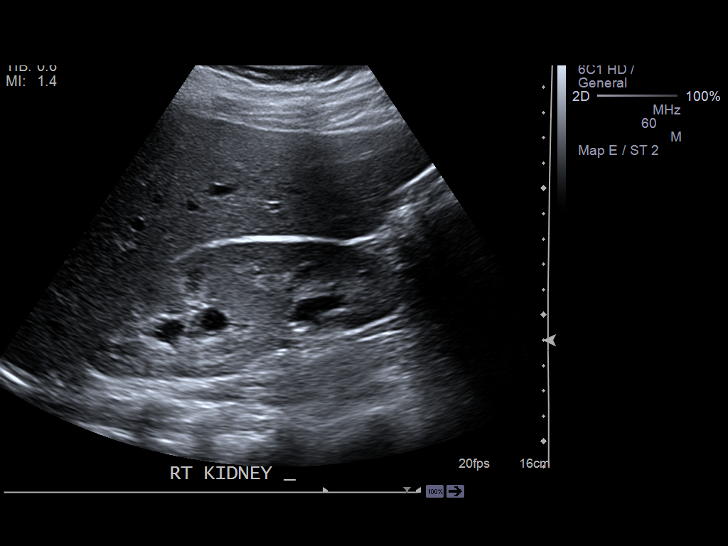
[im 8/45]
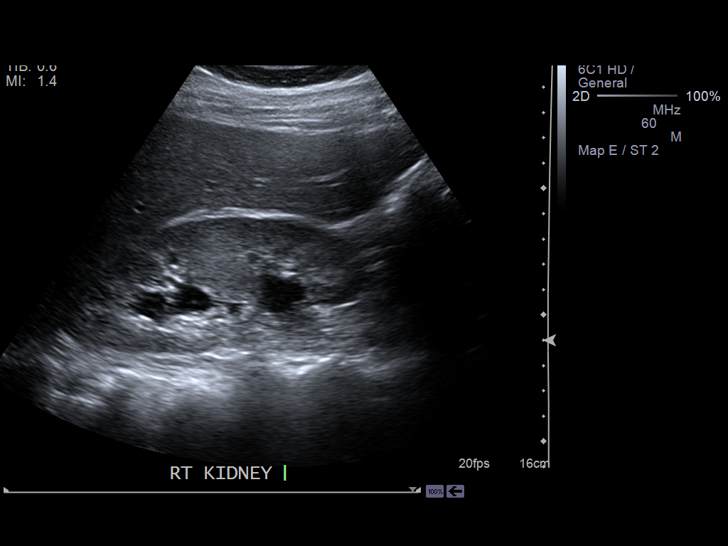
[im 12/45]
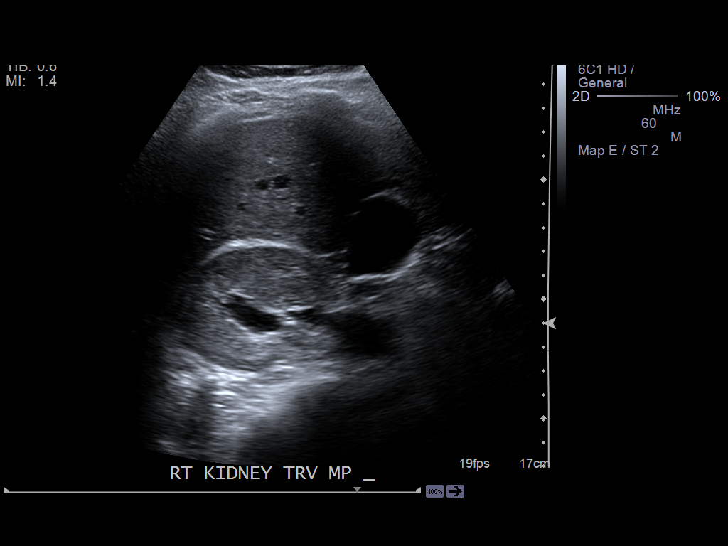
[im 15/45]
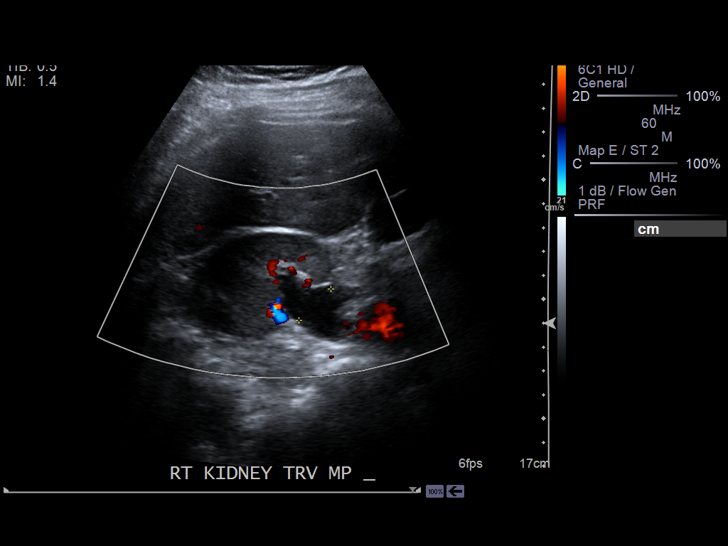
[im 17/45]
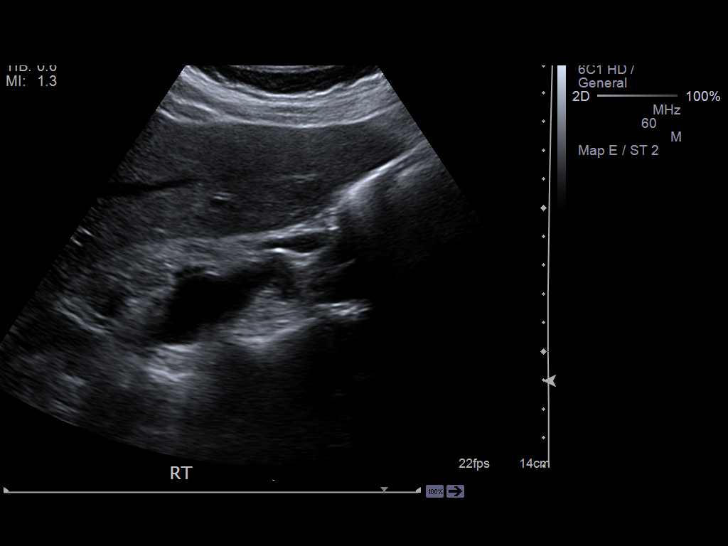
[im 21/45]
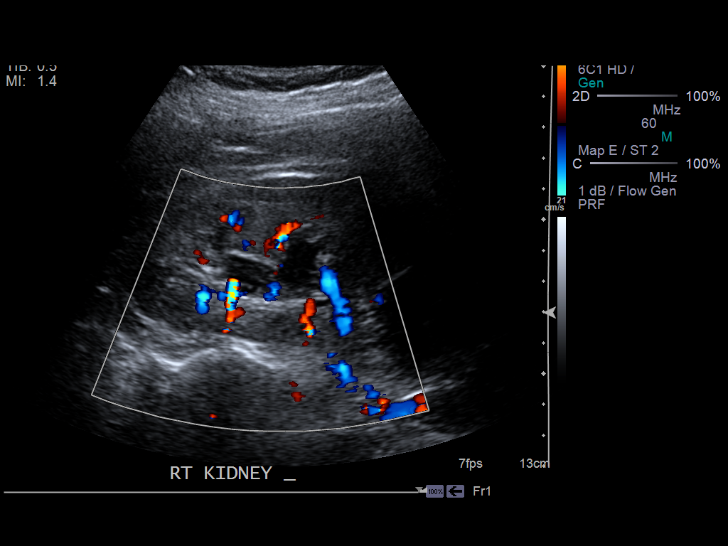
[im 24/45]
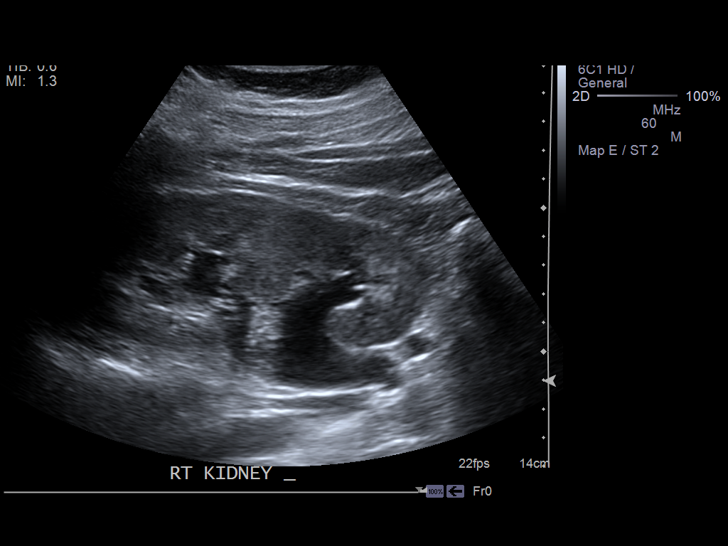
[im 28/45]
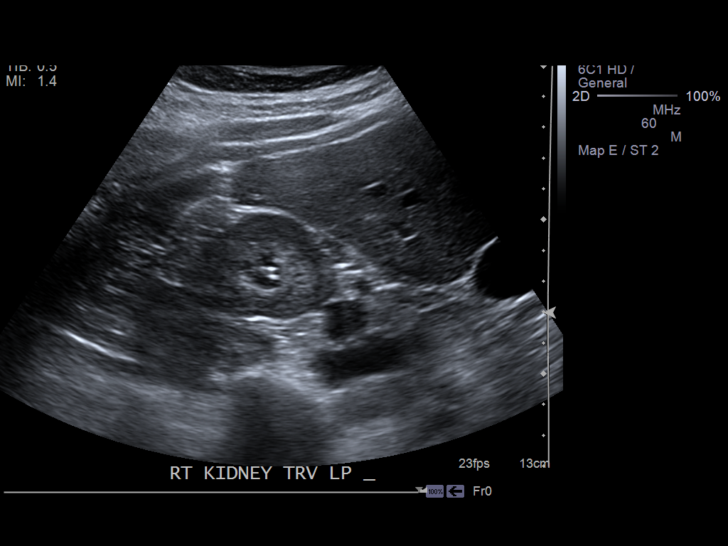
[im 30/45]
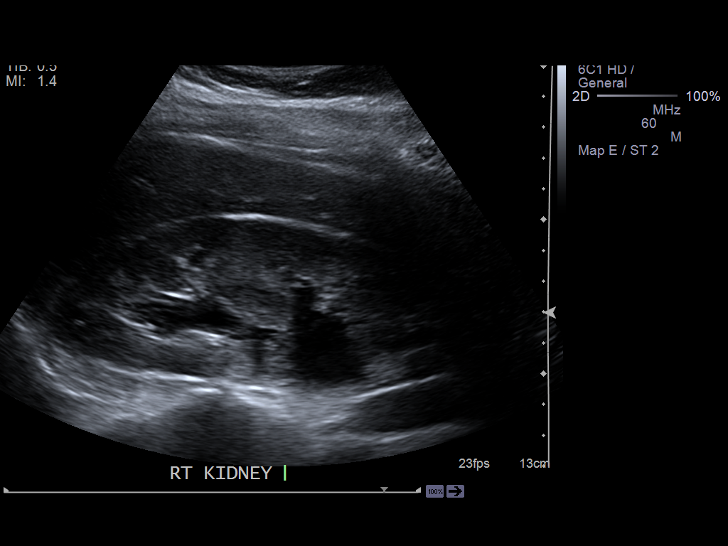
[im 34/45]
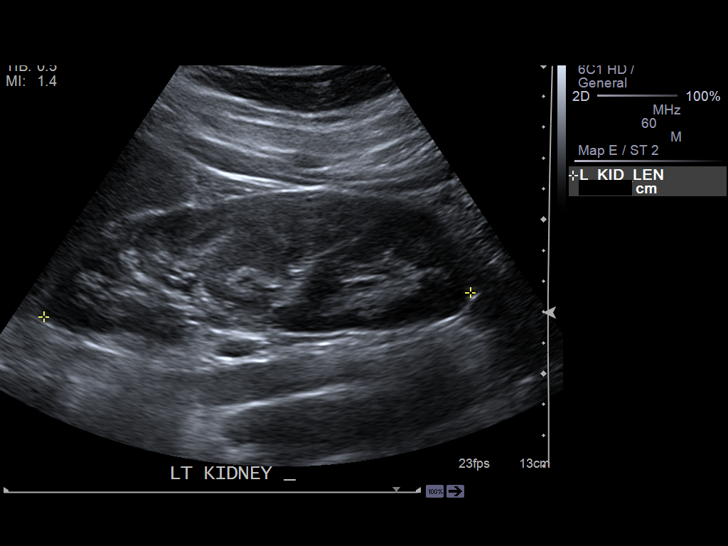
[im 37/45]
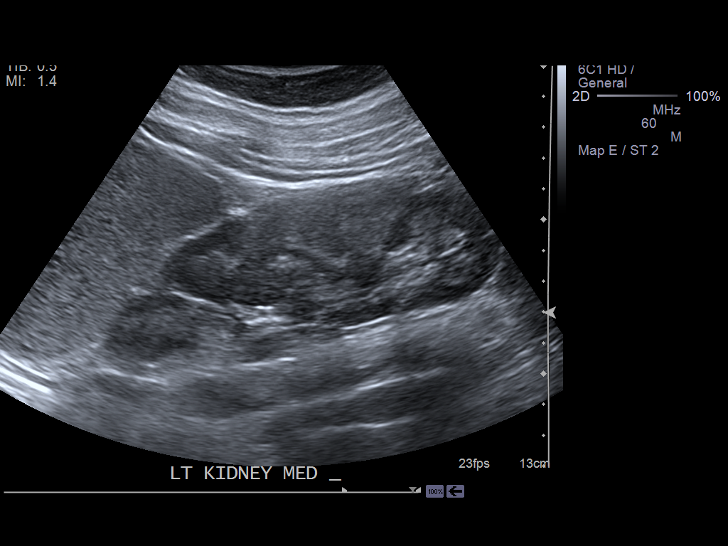
[im 41/45]
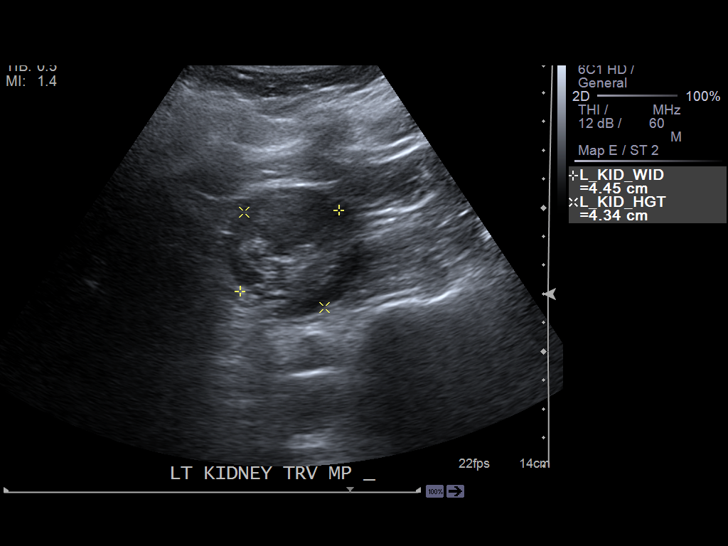
[im 45/45]
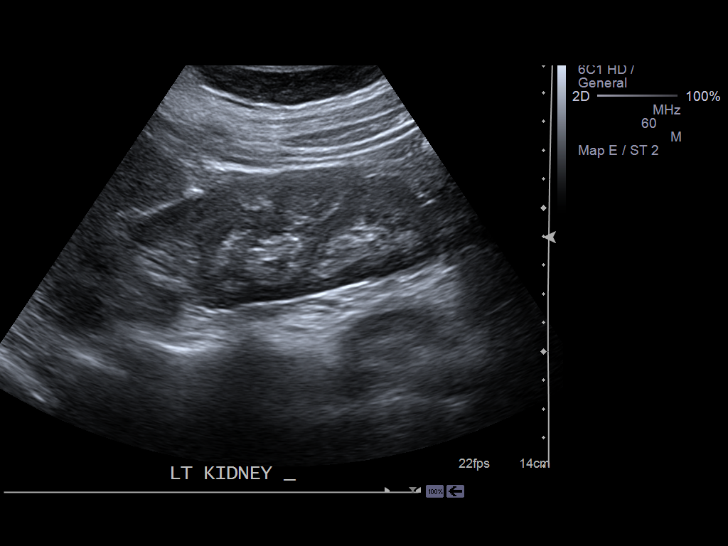

[14 of 25 positions shown; findings below may reference images not displayed]

PROCEDURE:     US  - US KIDNEY  - March 26, 2012  [DATE]

RESULT:     Comparison: None

Technique and findings: Multiple gray-scale and color doppler images of the
kidneys were obtained.

The right kidney measures 12.9 x 5.2 x 5.4 cm and the left kidney measures
13.9 x 4.5 x 4.3 cm. The kidneys are normal in echogenicity. There is a
duplicated left renal collecting system. There is a 6 mm right lower pole
renal calculus. There is moderate right hydronephrosis..  There are no renal
masses. There is no free fluid in the region of the renal fossa.
IMPRESSION: 1. Moderate right hydronephrosis.
2. Right nephrolithiasis.

[REDACTED]

## 2014-09-12 NOTE — Op Note (Signed)
PATIENT NAME:  Paige Stafford, [name] MR#:  [id number] DATE OF BIRTH:  [birth year]  DATE OF PROCEDURE:  10/10/2011  PREOPERATIVE DIAGNOSIS: Fetal bradycardia.   POSTOPERATIVE DIAGNOSIS: Fetal bradycardia.   PROCEDURE PERFORMED: Primary C-section   SURGEON: Senaida LangeLashawn Weaver Lee, M.D.   ASSISTANT: Tammy Gordy - Scrub Tech  COMPLICATIONS: Infant death.  SPECIMEN: Placenta.   INDICATIONS: The patient is a 29 year old gravida 1 who presents for induction of labor. She had Cervidil placed the night upon admission and Pitocin started the following day. Stat C-section was performed for fetal bradycardia.   Risks, benefits, indications, and alternatives of the procedure were explained and informed consent was obtained.   DESCRIPTION OF PROCEDURE: The patient was taken to the Operating Room with IV fluids running. She was prepped and draped in the usual sterile fashion. She was placed under general anesthesia. She was in a leftward tilt position. A Pfannenstiel skin incision was made and carried down to the underlying fascia with the knife. The fascia was nicked in the midline. The excision was bluntly extended. Kocher clamps were placed on the superior aspect of the fascia. The underlying rectus muscles were dissected off sharply. This opening was extended. The bladder blade was placed. Vesicouterine peritoneum was identified and incised using a knife. The bladder flap was created digitally. The bladder blade was replaced. The infant's head was grasped and delivered atraumatically through the hysterotomy incision. There was a loose nuchal cord, however, this did not need to be reduced. The anterior and posterior shoulder were delivered followed by the remainder of the body. The cord was clamped x2 and cut and the infant was handed to the awaiting neonatologist. The uterus was exteriorized. The placenta was expressed. The uterus was cleared of all clot and debris. The hysterotomy incision was repaired with a #0 Monocryl  in a running locked fashion. The uterus was returned to the abdomen. The abdomen and gutters were irrigated with copious amounts of warm normal saline. The peritoneum was closed with 2-0 Vicryl. The On-Q pump apparatus for pain control was placed. The fascia was closed with a #1 PDS. The skin was closed with staples. The two catheters of the previously placed On-Q pump were bolused with 5 mL of bupivacaine, and the catheters were secured in place using Tegaderm. The patient tolerated the procedure well. Sponge, lap, needle and instrument counts were correct x2. The patient was awakened from anesthesia and taken to the recovery room in stable condition.   FINDINGS: Vertex female infant, loose nuchal cord, Apgars 0 and 0.  ____________________________ Sonda PrimesLashawn A. Patton SallesWeaver-Lee, MD law:slb D: 10/18/2011 09:52:52 ET T: 10/18/2011 12:22:24 ET JOB#: [id number]  cc: Flint MelterLashawn A. Patton SallesWeaver-Lee, MD, <Dictator> Sonda PrimesLASHAWN A WEAVER LEE MD ELECTRONICALLY SIGNED 10/18/2011 14:21

## 2015-12-02 LAB — OB RESULTS CONSOLE ABO/RH: RH TYPE: NEGATIVE

## 2015-12-02 LAB — OB RESULTS CONSOLE ANTIBODY SCREEN: Antibody Screen: POSITIVE

## 2015-12-04 ENCOUNTER — Encounter (HOSPITAL_COMMUNITY): Payer: Self-pay | Admitting: *Deleted

## 2015-12-04 ENCOUNTER — Inpatient Hospital Stay (HOSPITAL_COMMUNITY)
Admission: AD | Admit: 2015-12-04 | Discharge: 2015-12-04 | Disposition: A | Discharge status: Home / Self Care | Payer: BLUE CROSS/BLUE SHIELD | Source: Ambulatory Visit | Attending: Obstetrics and Gynecology | Admitting: Obstetrics and Gynecology

## 2015-12-04 ENCOUNTER — Inpatient Hospital Stay (HOSPITAL_COMMUNITY): Payer: BLUE CROSS/BLUE SHIELD

## 2015-12-04 DIAGNOSIS — Z3A08 8 weeks gestation of pregnancy: Secondary | ICD-10-CM | POA: Diagnosis not present

## 2015-12-04 DIAGNOSIS — O208 Other hemorrhage in early pregnancy: Secondary | ICD-10-CM | POA: Insufficient documentation

## 2015-12-04 DIAGNOSIS — Z3490 Encounter for supervision of normal pregnancy, unspecified, unspecified trimester: Secondary | ICD-10-CM

## 2015-12-04 DIAGNOSIS — Z331 Pregnant state, incidental: Secondary | ICD-10-CM | POA: Diagnosis not present

## 2015-12-04 DIAGNOSIS — O43891 Other placental disorders, first trimester: Secondary | ICD-10-CM | POA: Insufficient documentation

## 2015-12-04 DIAGNOSIS — O418X10 Other specified disorders of amniotic fluid and membranes, first trimester, not applicable or unspecified: Secondary | ICD-10-CM

## 2015-12-04 DIAGNOSIS — O4691 Antepartum hemorrhage, unspecified, first trimester: Secondary | ICD-10-CM

## 2015-12-04 DIAGNOSIS — O468X1 Other antepartum hemorrhage, first trimester: Secondary | ICD-10-CM

## 2015-12-04 DIAGNOSIS — O209 Hemorrhage in early pregnancy, unspecified: Secondary | ICD-10-CM

## 2015-12-04 LAB — CBC
HCT: 38.7 % (ref 36.0–46.0)
Hemoglobin: 13.9 g/dL (ref 12.0–15.0)
MCH: 33.9 pg (ref 26.0–34.0)
MCHC: 35.9 g/dL (ref 30.0–36.0)
MCV: 94.4 fL (ref 78.0–100.0)
PLATELETS: 203 10*3/uL (ref 150–400)
RBC: 4.1 MIL/uL (ref 3.87–5.11)
RDW: 12.9 % (ref 11.5–15.5)
WBC: 10.4 10*3/uL (ref 4.0–10.5)

## 2015-12-04 LAB — URINALYSIS, ROUTINE W REFLEX MICROSCOPIC
Bilirubin Urine: NEGATIVE
Glucose, UA: NEGATIVE mg/dL
KETONES UR: 15 mg/dL — AB
LEUKOCYTES UA: NEGATIVE
NITRITE: NEGATIVE
Protein, ur: NEGATIVE mg/dL
SPECIFIC GRAVITY, URINE: 1.02 (ref 1.005–1.030)
pH: 6 (ref 5.0–8.0)

## 2015-12-04 LAB — RH IG WORKUP (INCLUDES ABO/RH)
ABO/RH(D): A NEG
ANTIBODY SCREEN: POSITIVE
DAT, IgG: NEGATIVE
Gestational Age(Wks): 8

## 2015-12-04 LAB — URINE MICROSCOPIC-ADD ON
Bacteria, UA: NONE SEEN
WBC UA: NONE SEEN WBC/hpf (ref 0–5)

## 2015-12-04 LAB — POCT PREGNANCY, URINE: Preg Test, Ur: POSITIVE — AB

## 2015-12-04 MED ORDER — RHO D IMMUNE GLOBULIN 1500 UNIT/2ML IJ SOSY
300.0000 ug | PREFILLED_SYRINGE | Freq: Once | INTRAMUSCULAR | Status: DC
  Filled 2015-12-04: qty 2

## 2015-12-04 NOTE — MAU Note (Signed)
Pt states she had an ultrasound at 7 weeks at the office and had an office visit on Friday.  Pt states the it was just a routine prenatal appointment on Friday.  Pt states she is having vaginal bleeding that started around 1030 today.  Pt states when she first went she had a gush that made a spot on her underwear about the size of a "cookie" (she showed the amount with her hands).  Pt states since then it has been collecting on her pad but she has not had to change the pad yet.  Pt states every time she goes to the bathroom it comes out.  Pt states the bleeding is bright red.  Pt denies any pain.

## 2015-12-04 NOTE — MAU Provider Note (Signed)
History     CSN: [id number]  Arrival date and time: 12/04/15 1155   First Provider Initiated Contact with Patient 12/04/15 1223      Chief Complaint  Patient presents with  . Vaginal Bleeding   Vaginal Bleeding The patient's primary symptoms include vaginal bleeding. This is a new problem. The current episode started today (started at 10:00 am ). The problem occurs constantly. The problem has been unchanged. The patient is experiencing no pain. She is pregnant. Pertinent negatives include no abdominal pain, chills, constipation, diarrhea, dysuria, fever, frequency, nausea, urgency or vomiting. The vaginal discharge was bloody. The vaginal bleeding is typical of menses. She has not been passing clots. She has not been passing tissue. Exacerbated by: Patient has had intercourse in the last 24 hours.  She has tried nothing for the symptoms. She is sexually active. Menstrual history: LMP 10/07/15      Past Medical History  Diagnosis Date  . Heartburn in pregnancy   . Kidney stones     passsed stone - no surgery  . Postpartum care following cesarean delivery (9/23) 02/10/2013    Past Surgical History  Procedure Laterality Date  . Cesarean section  2013    [facility] - 41 gestation fetal distress with fetal demise  . Wisdom tooth extraction  2012  . Repair perineal tear      at age 84 yrs old  . Cesarean section N/A 02/10/2013    Procedure: Repeat CESAREAN SECTION;  Surgeon: Lenoard Aden, MD;  Location: [facility];  Service: Obstetrics;  Laterality: N/A;  EDD: 02/17/13    History reviewed. No pertinent family history.  Social History  Substance Use Topics  . Smoking status: Never Smoker   . Smokeless tobacco: Never Used  . Alcohol Use: No    Allergies:  Allergies  Allergen Reactions  . Hydrocodone Nausea Only  . Latex Rash    Prescriptions prior to admission  Medication Sig Dispense Refill Last Dose  . calcium carbonate (OS-CAL) 1250 MG chewable tablet Chew 1  tablet by mouth daily.   Past Week at Unknown  . ibuprofen (ADVIL,MOTRIN) 600 MG tablet Take 1 tablet (600 mg total) by mouth every 6 (six) hours. 30 tablet 0   . oxyCODONE-acetaminophen (PERCOCET/ROXICET) 5-325 MG per tablet Take 1-2 tablets by mouth every 4 (four) hours as needed. 30 tablet 0   . Prenatal Vit-Fe Fumarate-FA (MULTIVITAMIN-PRENATAL) 27-0.8 MG TABS Take 1 tablet by mouth daily at 12 noon.   02/09/2013 at Unknown    Review of Systems  Constitutional: Negative for fever and chills.  Gastrointestinal: Negative for nausea, vomiting, abdominal pain, diarrhea and constipation.  Genitourinary: Positive for vaginal bleeding. Negative for dysuria, urgency and frequency.   Physical Exam   Blood pressure 105/54, pulse 85, temperature 98.3 F (36.8 C), temperature source Oral, resp. rate 18, last menstrual period 10/07/2015, unknown if currently breastfeeding.  Physical Exam  Nursing note and vitals reviewed. Constitutional: She is oriented to person, place, and time. She appears well-developed and well-nourished. No distress.  HENT:  Head: Normocephalic.  Cardiovascular: Normal rate.   Respiratory: Effort normal.  GI: Soft. There is no tenderness. There is no rebound.  Genitourinary:   External: no lesion Vagina: small amount of blood seen  Cervix: pink, smooth, no CMT, closed  Uterus: NSSC Adnexa: NT   Neurological: She is alert and oriented to person, place, and time.  Skin: Skin is warm and dry.  Psychiatric: She has a normal mood and affect.  Results for orders placed or performed during the hospital encounter of 12/04/15 (from the past 24 hour(s))  Urinalysis, Routine w reflex microscopic (not at [facility])     Status: Abnormal   Collection Time: 12/04/15 12:06 PM  Result Value Ref Range   Color, Urine YELLOW YELLOW   APPearance HAZY (A) CLEAR   Specific Gravity, Urine 1.020 1.005 - 1.030   pH 6.0 5.0 - 8.0   Glucose, UA NEGATIVE NEGATIVE mg/dL   Hgb urine dipstick  LARGE (A) NEGATIVE   Bilirubin Urine NEGATIVE NEGATIVE   Ketones, ur 15 (A) NEGATIVE mg/dL   Protein, ur NEGATIVE NEGATIVE mg/dL   Nitrite NEGATIVE NEGATIVE   Leukocytes, UA NEGATIVE NEGATIVE  Urine microscopic-add on     Status: Abnormal   Collection Time: 12/04/15 12:06 PM  Result Value Ref Range   Squamous Epithelial / LPF 0-5 (A) NONE SEEN   WBC, UA NONE SEEN 0 - 5 WBC/hpf   RBC / HPF TOO NUMEROUS TO COUNT 0 - 5 RBC/hpf   Bacteria, UA NONE SEEN NONE SEEN   Urine-Other MUCOUS PRESENT   Pregnancy, urine POC     Status: Abnormal   Collection Time: 12/04/15 12:21 PM  Result Value Ref Range   Preg Test, Ur POSITIVE (A) NEGATIVE  CBC     Status: None   Collection Time: 12/04/15 12:28 PM  Result Value Ref Range   WBC 10.4 4.0 - 10.5 K/uL   RBC 4.10 3.87 - 5.11 MIL/uL   Hemoglobin 13.9 12.0 - 15.0 g/dL   HCT 30.8 65.7 - 84.6 %   MCV 94.4 78.0 - 100.0 fL   MCH 33.9 26.0 - 34.0 pg   MCHC 35.9 30.0 - 36.0 g/dL   RDW 96.2 95.2 - 84.1 %   Platelets 203 150 - 400 K/uL  Rh IG workup (includes ABO/Rh)     Status: None (Preliminary result)   Collection Time: 12/04/15 12:29 PM  Result Value Ref Range   Gestational Age(Wks) 8    ABO/RH(D) A NEG    Antibody Screen PENDING     US Ob Comp Less 14 Wks  12/04/2015  CLINICAL DATA:  Bleeding since 10:30 a.m. LMP 10/07/2015. By LMP patient is 8 weeks 2 days. EDC by LMP is 07/13/2016. EXAM: OBSTETRIC <14 WK ULTRASOUND TECHNIQUE: Transabdominal ultrasound was performed for evaluation of the gestation as well as the maternal uterus and adnexal regions. COMPARISON:  None applicable FINDINGS: Intrauterine gestational sac: Present Yolk sac:  Present Embryo:  Present Cardiac Activity: Present Heart Rate: 178 bpm CRL:   20  mm   8 w 3 d                  [place] EDC: 07/12/2016 Subchorionic hemorrhage: Moderate subchorionic hemorrhage is present. Measures 4.3 x 3.8 x 3.9 cm. Maternal uterus/adnexae: Normal appearance of the ovaries. No free pelvic fluid.  IMPRESSION: Single living intrauterine embryo. Size and dates correlate well. Today's exam confirms clinical EDC of 07/13/2016. Electronically Signed   By: Norva Pavlov M.D.   On: 12/04/2015 13:11    MAU Course  Procedures  MDM 1220: D/W Dr. Juliene Pina, will do rhogam work up (patient is A neg per Dr. Juliene Pina), CBC and [place]. If US shows viable IUP then she can be dc home.  1415: Lab called: antibody screen is positive. Patient states that she had rhogam at 5 weeks in the office (patient has pocket card with her today).  1418: D/W Dr. Juliene Pina does not need rhogam  today.   Assessment and Plan   1. Intrauterine pregnancy   2. Vaginal bleeding in pregnancy, first trimester   3. [redacted] weeks gestation of pregnancy   4. Subchorionic hematoma in first trimester    DC home Comfort measures reviewed  1st Trimester precautions  Bleeding precautions Pelvic rest  RX: none  Return to MAU as needed FU with OB as planned  Follow-up Information    Follow up with Lenoard AdenAAVON,RICHARD J, MD.   Specialty:  Obstetrics and Gynecology   Why:  As scheduled   Contact information:   [address] [place] [id number] [phone]         Tawnya CrookHogan, Deandrea Rion Donovan 12/04/2015, 1:34 PM

## 2015-12-04 NOTE — Discharge Instructions (Signed)
Subchorionic Hematoma °A subchorionic hematoma is a gathering of blood between the outer wall of the placenta and the inner wall of the womb (uterus). The placenta is the organ that connects the fetus to the wall of the uterus. The placenta performs the feeding, breathing (oxygen to the fetus), and waste removal (excretory work) of the fetus.  °Subchorionic hematoma is the most common abnormality found on a result from ultrasonography done during the first trimester or early second trimester of pregnancy. If there has been little or no vaginal bleeding, early small hematomas usually shrink on their own and do not affect your baby or pregnancy. The blood is gradually absorbed over 1-2 weeks. When bleeding starts later in pregnancy or the hematoma is larger or occurs in an older pregnant woman, the outcome may not be as good. Larger hematomas may get bigger, which increases the chances for miscarriage. Subchorionic hematoma also increases the risk of premature detachment of the placenta from the uterus, preterm (premature) labor, and stillbirth. °HOME CARE INSTRUCTIONS °· Stay on bed rest if your health care provider recommends this. Although bed rest will not prevent more bleeding or prevent a miscarriage, your health care provider may recommend bed rest until you are advised otherwise. °· Avoid heavy lifting (more than 10 lb [4.5 kg]), exercise, sexual intercourse, or douching as directed by your health care provider. °· Keep track of the number of pads you use each day and how soaked (saturated) they are. Write down this information. °· Do not use tampons. °· Keep all follow-up appointments as directed by your health care provider. Your health care provider may ask you to have follow-up blood tests or ultrasound tests or both. °SEEK IMMEDIATE MEDICAL CARE IF: °· You have severe cramps in your stomach, back, abdomen, or pelvis. °· You have a fever. °· You pass large clots or tissue. Save any tissue for your health  care provider to look at. °· Your bleeding increases or you become lightheaded, feel weak, or have fainting episodes. °  °This information is not intended to replace advice given to you by your health care provider. Make sure you discuss any questions you have with your health care provider. °  °Document Released: 08/22/2006 Document Revised: 05/28/2014 Document Reviewed: 12/04/2012 °Elsevier Interactive Patient Education ©2016 [employer] Inc[employer] ° °

## 2016-06-13 LAB — OB RESULTS CONSOLE GBS: GBS: NEGATIVE

## 2016-06-14 ENCOUNTER — Other Ambulatory Visit: Payer: Self-pay | Admitting: Obstetrics and Gynecology

## 2016-06-27 ENCOUNTER — Encounter (HOSPITAL_COMMUNITY): Payer: Self-pay

## 2016-06-27 ENCOUNTER — Telehealth (HOSPITAL_COMMUNITY): Payer: Self-pay | Admitting: *Deleted

## 2016-06-27 NOTE — Telephone Encounter (Signed)
Preadmission screen  

## 2016-06-28 ENCOUNTER — Encounter (HOSPITAL_COMMUNITY): Payer: Self-pay

## 2016-07-06 ENCOUNTER — Encounter (HOSPITAL_COMMUNITY)
Admission: RE | Admit: 2016-07-06 | Discharge: 2016-07-06 | Disposition: A | Discharge status: Home / Self Care | Payer: BLUE CROSS/BLUE SHIELD | Source: Ambulatory Visit | Attending: Obstetrics and Gynecology | Admitting: Obstetrics and Gynecology

## 2016-07-06 LAB — CBC
HCT: 35.6 % — ABNORMAL LOW (ref 36.0–46.0)
HEMOGLOBIN: 12.4 g/dL (ref 12.0–15.0)
MCH: 34 pg (ref 26.0–34.0)
MCHC: 34.8 g/dL (ref 30.0–36.0)
MCV: 97.5 fL (ref 78.0–100.0)
Platelets: 134 10*3/uL — ABNORMAL LOW (ref 150–400)
RBC: 3.65 MIL/uL — AB (ref 3.87–5.11)
RDW: 13.4 % (ref 11.5–15.5)
WBC: 8.1 10*3/uL (ref 4.0–10.5)

## 2016-07-06 NOTE — Patient Instructions (Signed)
20 Paige FreesLaura G Stafford  07/06/2016   Your procedure is scheduled on:  07/09/2016  [facility] at 1230 PM.  Pick up the phone at the desk and dial [phone].   Call this number if you have problems the morning of surgery: [phone]   Remember:   Do not eat food:After Midnight.  Do not drink clear liquids: After Midnight.  Take these medicines the morning of surgery with A SIP OF WATER: none   Do not wear jewelry, make-up or nail polish.  Do not wear lotions, powders, or perfumes. Do not wear deodorant.  Do not shave 48 hours prior to surgery.  Do not bring valuables to the hospital.  [facility] is not   responsible for any belongings or valuables brought to the hospital.  Contacts, dentures or bridgework may not be worn into surgery.  Leave suitcase in the car. After surgery it may be brought to your room.  For patients admitted to the hospital, checkout time is 11:00 AM the day of              discharge.   Patients discharged the day of surgery will not be allowed to drive             home.  Name and phone number of your driver: na  Special Instructions:   N/A   Please read over the following fact sheets that you were given:   Surgical Site Infection Prevention

## 2016-07-07 LAB — RPR: RPR: NONREACTIVE

## 2016-07-09 ENCOUNTER — Inpatient Hospital Stay (HOSPITAL_COMMUNITY)
Admission: RE | Admit: 2016-07-09 | Discharge: 2016-07-11 | DRG: 765 | Disposition: A | Discharge status: Home / Self Care | Payer: BLUE CROSS/BLUE SHIELD | Source: Ambulatory Visit | Attending: Obstetrics and Gynecology | Admitting: Obstetrics and Gynecology

## 2016-07-09 ENCOUNTER — Encounter (HOSPITAL_COMMUNITY): Payer: Self-pay | Admitting: *Deleted

## 2016-07-09 ENCOUNTER — Inpatient Hospital Stay (HOSPITAL_COMMUNITY): Payer: BLUE CROSS/BLUE SHIELD | Admitting: Anesthesiology

## 2016-07-09 ENCOUNTER — Encounter (HOSPITAL_COMMUNITY)
Admission: RE | Disposition: A | Discharge status: Home / Self Care | Payer: Self-pay | Source: Ambulatory Visit | Attending: Obstetrics and Gynecology

## 2016-07-09 DIAGNOSIS — O9962 Diseases of the digestive system complicating childbirth: Secondary | ICD-10-CM | POA: Diagnosis present

## 2016-07-09 DIAGNOSIS — E669 Obesity, unspecified: Secondary | ICD-10-CM | POA: Diagnosis present

## 2016-07-09 DIAGNOSIS — Z6831 Body mass index (BMI) 31.0-31.9, adult: Secondary | ICD-10-CM | POA: Diagnosis not present

## 2016-07-09 DIAGNOSIS — Z87442 Personal history of urinary calculi: Secondary | ICD-10-CM

## 2016-07-09 DIAGNOSIS — O34219 Maternal care for unspecified type scar from previous cesarean delivery: Secondary | ICD-10-CM | POA: Diagnosis present

## 2016-07-09 DIAGNOSIS — Z6791 Unspecified blood type, Rh negative: Secondary | ICD-10-CM

## 2016-07-09 DIAGNOSIS — O34211 Maternal care for low transverse scar from previous cesarean delivery: Principal | ICD-10-CM | POA: Diagnosis present

## 2016-07-09 DIAGNOSIS — K219 Gastro-esophageal reflux disease without esophagitis: Secondary | ICD-10-CM | POA: Diagnosis present

## 2016-07-09 DIAGNOSIS — O26899 Other specified pregnancy related conditions, unspecified trimester: Secondary | ICD-10-CM

## 2016-07-09 DIAGNOSIS — D693 Immune thrombocytopenic purpura: Secondary | ICD-10-CM | POA: Diagnosis present

## 2016-07-09 DIAGNOSIS — O9912 Other diseases of the blood and blood-forming organs and certain disorders involving the immune mechanism complicating childbirth: Secondary | ICD-10-CM | POA: Diagnosis present

## 2016-07-09 DIAGNOSIS — Z3A39 39 weeks gestation of pregnancy: Secondary | ICD-10-CM | POA: Diagnosis not present

## 2016-07-09 DIAGNOSIS — O99214 Obesity complicating childbirth: Secondary | ICD-10-CM | POA: Diagnosis present

## 2016-07-09 DIAGNOSIS — O26892 Other specified pregnancy related conditions, second trimester: Secondary | ICD-10-CM | POA: Diagnosis present

## 2016-07-09 LAB — CBC
HCT: 34.6 % — ABNORMAL LOW (ref 36.0–46.0)
HEMOGLOBIN: 12.5 g/dL (ref 12.0–15.0)
MCH: 35 pg — ABNORMAL HIGH (ref 26.0–34.0)
MCHC: 36.1 g/dL — ABNORMAL HIGH (ref 30.0–36.0)
MCV: 96.9 fL (ref 78.0–100.0)
Platelets: 129 10*3/uL — ABNORMAL LOW (ref 150–400)
RBC: 3.57 MIL/uL — ABNORMAL LOW (ref 3.87–5.11)
RDW: 13.2 % (ref 11.5–15.5)
WBC: 9.4 10*3/uL (ref 4.0–10.5)

## 2016-07-09 SURGERY — Surgical Case
Anesthesia: Spinal

## 2016-07-09 MED ORDER — LACTATED RINGERS IV SOLN
INTRAVENOUS | Status: DC | PRN
  Administered 2016-07-09: 40 [IU] via INTRAVENOUS

## 2016-07-09 MED ORDER — SOD CITRATE-CITRIC ACID 500-334 MG/5ML PO SOLN
30.0000 mL | Freq: Once | ORAL | Status: AC
  Administered 2016-07-09: 30 mL via ORAL
  Filled 2016-07-09: qty 15

## 2016-07-09 MED ORDER — ACETAMINOPHEN 500 MG PO TABS
1000.0000 mg | ORAL_TABLET | Freq: Four times a day (QID) | ORAL | Status: DC
  Administered 2016-07-09: 1000 mg via ORAL
  Filled 2016-07-09: qty 2

## 2016-07-09 MED ORDER — PHENYLEPHRINE 8 MG IN D5W 100 ML (0.08MG/ML) PREMIX OPTIME
INJECTION | INTRAVENOUS | Status: AC
  Filled 2016-07-09: qty 100

## 2016-07-09 MED ORDER — COCONUT OIL OIL: 1.0000 "application " | TOPICAL_OIL | Status: DC | PRN

## 2016-07-09 MED ORDER — BUPIVACAINE IN DEXTROSE 0.75-8.25 % IT SOLN
INTRATHECAL | Status: DC | PRN
  Administered 2016-07-09: 1.4 mL via INTRATHECAL

## 2016-07-09 MED ORDER — PHENYLEPHRINE 8 MG IN D5W 100 ML (0.08MG/ML) PREMIX OPTIME
INJECTION | INTRAVENOUS | Status: AC
Start: 2016-07-09 — End: 2016-07-09
  Filled 2016-07-09: qty 100

## 2016-07-09 MED ORDER — OXYTOCIN 10 UNIT/ML IJ SOLN
INTRAMUSCULAR | Status: AC
  Filled 2016-07-09: qty 4

## 2016-07-09 MED ORDER — FENTANYL CITRATE (PF) 100 MCG/2ML IJ SOLN
INTRAMUSCULAR | Status: DC | PRN
  Administered 2016-07-09: 20 ug via INTRATHECAL

## 2016-07-09 MED ORDER — DIPHENHYDRAMINE HCL 25 MG PO CAPS: 25.0000 mg | ORAL_CAPSULE | Freq: Four times a day (QID) | ORAL | Status: DC | PRN

## 2016-07-09 MED ORDER — DIBUCAINE 1 % RE OINT
1.0000 "application " | TOPICAL_OINTMENT | RECTAL | Status: DC | PRN
  Administered 2016-07-10: 1 via RECTAL
  Filled 2016-07-09: qty 28

## 2016-07-09 MED ORDER — ACETAMINOPHEN 325 MG PO TABS
650.0000 mg | ORAL_TABLET | ORAL | Status: DC | PRN
  Administered 2016-07-10: 650 mg via ORAL
  Filled 2016-07-09: qty 2

## 2016-07-09 MED ORDER — SENNOSIDES-DOCUSATE SODIUM 8.6-50 MG PO TABS
2.0000 | ORAL_TABLET | ORAL | Status: DC
  Administered 2016-07-09 – 2016-07-10 (×2): 2 via ORAL
  Filled 2016-07-09 (×2): qty 2

## 2016-07-09 MED ORDER — IBUPROFEN 600 MG PO TABS
600.0000 mg | ORAL_TABLET | Freq: Four times a day (QID) | ORAL | Status: DC
  Administered 2016-07-09 – 2016-07-11 (×7): 600 mg via ORAL
  Filled 2016-07-09 (×7): qty 1

## 2016-07-09 MED ORDER — FENTANYL CITRATE (PF) 100 MCG/2ML IJ SOLN
25.0000 ug | INTRAMUSCULAR | Status: DC | PRN
  Administered 2016-07-09: 50 ug via INTRAVENOUS

## 2016-07-09 MED ORDER — TETANUS-DIPHTH-ACELL PERTUSSIS 5-2.5-18.5 LF-MCG/0.5 IM SUSP: 0.5000 mL | Freq: Once | INTRAMUSCULAR | Status: DC

## 2016-07-09 MED ORDER — LACTATED RINGERS IV SOLN
INTRAVENOUS | Status: DC
  Administered 2016-07-10: 01:00:00 via INTRAVENOUS

## 2016-07-09 MED ORDER — OXYTOCIN 40 UNITS IN LACTATED RINGERS INFUSION - SIMPLE MED: 2.5000 [IU]/h | INTRAVENOUS | Status: AC

## 2016-07-09 MED ORDER — SIMETHICONE 80 MG PO CHEW
80.0000 mg | CHEWABLE_TABLET | Freq: Three times a day (TID) | ORAL | Status: DC
Start: 2016-07-09 — End: 2016-07-11
  Administered 2016-07-10 – 2016-07-11 (×4): 80 mg via ORAL
  Filled 2016-07-09 (×4): qty 1

## 2016-07-09 MED ORDER — PRENATAL MULTIVITAMIN CH
1.0000 | ORAL_TABLET | Freq: Every day | ORAL | Status: DC
  Administered 2016-07-10 – 2016-07-11 (×2): 1 via ORAL
  Filled 2016-07-09 (×2): qty 1

## 2016-07-09 MED ORDER — METHYLERGONOVINE MALEATE 0.2 MG PO TABS: 0.2000 mg | ORAL_TABLET | ORAL | Status: DC | PRN

## 2016-07-09 MED ORDER — DIPHENHYDRAMINE HCL 25 MG PO CAPS
25.0000 mg | ORAL_CAPSULE | ORAL | Status: DC | PRN
  Filled 2016-07-09: qty 1

## 2016-07-09 MED ORDER — SODIUM CHLORIDE 0.9 % IR SOLN
Status: DC | PRN
  Administered 2016-07-09: 1

## 2016-07-09 MED ORDER — NALBUPHINE HCL 10 MG/ML IJ SOLN: 5.0000 mg | INTRAMUSCULAR | Status: DC | PRN

## 2016-07-09 MED ORDER — CEFAZOLIN SODIUM-DEXTROSE 2-4 GM/100ML-% IV SOLN
2.0000 g | Freq: Once | INTRAVENOUS | Status: AC
  Administered 2016-07-09: 2 g via INTRAVENOUS

## 2016-07-09 MED ORDER — BUPIVACAINE HCL (PF) 0.25 % IJ SOLN
INTRAMUSCULAR | Status: AC
  Filled 2016-07-09: qty 30

## 2016-07-09 MED ORDER — LACTATED RINGERS IV SOLN
INTRAVENOUS | Status: DC | PRN
  Administered 2016-07-09: 15:00:00 via INTRAVENOUS

## 2016-07-09 MED ORDER — OXYCODONE-ACETAMINOPHEN 5-325 MG PO TABS: 1.0000 | ORAL_TABLET | ORAL | Status: DC | PRN

## 2016-07-09 MED ORDER — MORPHINE SULFATE (PF) 0.5 MG/ML IJ SOLN
INTRAMUSCULAR | Status: AC
  Filled 2016-07-09: qty 10

## 2016-07-09 MED ORDER — METHYLERGONOVINE MALEATE 0.2 MG/ML IJ SOLN: 0.2000 mg | INTRAMUSCULAR | Status: DC | PRN

## 2016-07-09 MED ORDER — SIMETHICONE 80 MG PO CHEW
80.0000 mg | CHEWABLE_TABLET | ORAL | Status: DC
  Administered 2016-07-09 – 2016-07-10 (×2): 80 mg via ORAL
  Filled 2016-07-09 (×2): qty 1

## 2016-07-09 MED ORDER — ONDANSETRON HCL 4 MG/2ML IJ SOLN
4.0000 mg | Freq: Three times a day (TID) | INTRAMUSCULAR | Status: DC | PRN
  Administered 2016-07-09: 4 mg via INTRAVENOUS

## 2016-07-09 MED ORDER — NALBUPHINE HCL 10 MG/ML IJ SOLN: 5.0000 mg | Freq: Once | INTRAMUSCULAR | Status: DC | PRN

## 2016-07-09 MED ORDER — OXYCODONE-ACETAMINOPHEN 5-325 MG PO TABS: 2.0000 | ORAL_TABLET | ORAL | Status: DC | PRN

## 2016-07-09 MED ORDER — NALOXONE HCL 0.4 MG/ML IJ SOLN
0.4000 mg | INTRAMUSCULAR | Status: DC | PRN
Start: 2016-07-09 — End: 2016-07-10

## 2016-07-09 MED ORDER — BUPIVACAINE HCL (PF) 0.25 % IJ SOLN
INTRAMUSCULAR | Status: DC | PRN
  Administered 2016-07-09: 30 mL

## 2016-07-09 MED ORDER — FENTANYL CITRATE (PF) 100 MCG/2ML IJ SOLN
INTRAMUSCULAR | Status: AC
  Filled 2016-07-09: qty 2

## 2016-07-09 MED ORDER — PHENYLEPHRINE 8 MG IN D5W 100 ML (0.08MG/ML) PREMIX OPTIME
INJECTION | INTRAVENOUS | Status: DC | PRN
  Administered 2016-07-09: 60 ug/min via INTRAVENOUS

## 2016-07-09 MED ORDER — SODIUM CHLORIDE 0.9% FLUSH: 3.0000 mL | INTRAVENOUS | Status: DC | PRN

## 2016-07-09 MED ORDER — LACTATED RINGERS IV SOLN
INTRAVENOUS | Status: DC
  Administered 2016-07-09 (×2): via INTRAVENOUS

## 2016-07-09 MED ORDER — WITCH HAZEL-GLYCERIN EX PADS
1.0000 "application " | MEDICATED_PAD | CUTANEOUS | Status: DC | PRN
  Administered 2016-07-10: 1 via TOPICAL

## 2016-07-09 MED ORDER — MEPERIDINE HCL 25 MG/ML IJ SOLN: 6.2500 mg | INTRAMUSCULAR | Status: DC | PRN

## 2016-07-09 MED ORDER — NALOXONE HCL 2 MG/2ML IJ SOSY
1.0000 ug/kg/h | PREFILLED_SYRINGE | INTRAVENOUS | Status: DC | PRN
  Filled 2016-07-09: qty 2

## 2016-07-09 MED ORDER — MENTHOL 3 MG MT LOZG: 1.0000 | LOZENGE | OROMUCOSAL | Status: DC | PRN

## 2016-07-09 MED ORDER — SIMETHICONE 80 MG PO CHEW: 80.0000 mg | CHEWABLE_TABLET | ORAL | Status: DC | PRN

## 2016-07-09 MED ORDER — DIPHENHYDRAMINE HCL 50 MG/ML IJ SOLN: 12.5000 mg | INTRAMUSCULAR | Status: DC | PRN

## 2016-07-09 MED ORDER — ONDANSETRON HCL 4 MG/2ML IJ SOLN
INTRAMUSCULAR | Status: AC
  Filled 2016-07-09: qty 2

## 2016-07-09 MED ORDER — ZOLPIDEM TARTRATE 5 MG PO TABS: 5.0000 mg | ORAL_TABLET | Freq: Every evening | ORAL | Status: DC | PRN

## 2016-07-09 MED ORDER — SCOPOLAMINE 1 MG/3DAYS TD PT72
1.0000 | MEDICATED_PATCH | TRANSDERMAL | Status: DC
  Administered 2016-07-09: 1.5 mg via TRANSDERMAL
  Filled 2016-07-09: qty 1

## 2016-07-09 MED ORDER — MORPHINE SULFATE (PF) 0.5 MG/ML IJ SOLN
INTRAMUSCULAR | Status: DC | PRN
  Administered 2016-07-09: .2 mg via INTRATHECAL

## 2016-07-09 SURGICAL SUPPLY — 34 items
BENZOIN TINCTURE PRP APPL 2/3 (GAUZE/BANDAGES/DRESSINGS) ×2 IMPLANT
CHLORAPREP W/TINT 26ML (MISCELLANEOUS) ×2 IMPLANT
CLAMP CORD UMBIL (MISCELLANEOUS) IMPLANT
CLOTH BEACON ORANGE TIMEOUT ST (SAFETY) ×2 IMPLANT
CONTAINER PREFILL 10% NBF 15ML (MISCELLANEOUS) IMPLANT
DRSG OPSITE POSTOP 4X10 (GAUZE/BANDAGES/DRESSINGS) ×2 IMPLANT
ELECT REM PT RETURN 9FT ADLT (ELECTROSURGICAL) ×2
ELECTRODE REM PT RTRN 9FT ADLT (ELECTROSURGICAL) ×1 IMPLANT
EXTRACTOR VACUUM M CUP 4 TUBE (SUCTIONS) IMPLANT
GLOVE BIO SURGEON STRL SZ7.5 (GLOVE) ×2 IMPLANT
GLOVE BIOGEL PI IND STRL 7.0 (GLOVE) ×1 IMPLANT
GLOVE BIOGEL PI INDICATOR 7.0 (GLOVE) ×1
GOWN STRL REUS W/TWL LRG LVL3 (GOWN DISPOSABLE) ×4 IMPLANT
KIT ABG SYR 3ML LUER SLIP (SYRINGE) IMPLANT
NEEDLE HYPO 22GX1.5 SAFETY (NEEDLE) ×2 IMPLANT
NEEDLE HYPO 25X5/8 SAFETYGLIDE (NEEDLE) IMPLANT
NEEDLE SPNL 20GX3.5 QUINCKE YW (NEEDLE) IMPLANT
NS IRRIG 1000ML POUR BTL (IV SOLUTION) ×2 IMPLANT
PACK C SECTION WH (CUSTOM PROCEDURE TRAY) ×2 IMPLANT
PENCIL SMOKE EVAC W/HOLSTER (ELECTROSURGICAL) ×2 IMPLANT
STRIP CLOSURE SKIN 1/2X4 (GAUZE/BANDAGES/DRESSINGS) ×2 IMPLANT
SUT MNCRL 0 VIOLET CTX 36 (SUTURE) ×2 IMPLANT
SUT MNCRL AB 3-0 PS2 27 (SUTURE) IMPLANT
SUT MON AB 2-0 CT1 27 (SUTURE) ×2 IMPLANT
SUT MON AB-0 CT1 36 (SUTURE) ×4 IMPLANT
SUT MONOCRYL 0 CTX 36 (SUTURE) ×2
SUT PLAIN 0 NONE (SUTURE) IMPLANT
SUT PLAIN 2 0 (SUTURE)
SUT PLAIN 2 0 XLH (SUTURE) IMPLANT
SUT PLAIN ABS 2-0 CT1 27XMFL (SUTURE) IMPLANT
SYR 20CC LL (SYRINGE) IMPLANT
SYR CONTROL 10ML LL (SYRINGE) ×2 IMPLANT
TOWEL OR 17X24 6PK STRL BLUE (TOWEL DISPOSABLE) ×2 IMPLANT
TRAY FOLEY CATH SILVER 14FR (SET/KITS/TRAYS/PACK) ×2 IMPLANT

## 2016-07-09 NOTE — Transfer of Care (Signed)
Immediate Anesthesia Transfer of Care Note  Patient: Paige Stafford  Procedure(s) Performed: Procedure(s) with comments: Repeat CESAREAN SECTION (N/A) - RNFA requested EDD: 07/13/16  Patient Location: PACU  Anesthesia Type:Spinal  Level of Consciousness: awake  Airway & Oxygen Therapy: Patient Spontanous Breathing  Post-op Assessment: Report given to RN  Post vital signs: Reviewed and stable  Last Vitals:  Vitals:   07/09/16 1309  BP: 111/74  Pulse: 85  Resp: 16  Temp: 37.1 C    Last Pain:  Vitals:   07/09/16 1309  TempSrc: Oral         Complications: No apparent anesthesia complications

## 2016-07-09 NOTE — Anesthesia Preprocedure Evaluation (Addendum)
Anesthesia Evaluation  Patient identified by MRN, date of birth, ID band Patient awake    Reviewed: Allergy & Precautions, NPO status , Patient's Chart, lab work & pertinent test results  Airway Mallampati: II  TM Distance: >3 FB Neck ROM: Full    Dental no notable dental hx. (+) Teeth Intact   Pulmonary neg pulmonary ROS,    Pulmonary exam normal breath sounds clear to auscultation       Cardiovascular negative cardio ROS Normal cardiovascular exam Rhythm:Regular Rate:Normal     Neuro/Psych    GI/Hepatic Neg liver ROS, GERD  Controlled and Medicated,  Endo/Other  Obesity  Renal/GU Renal diseaseHx/o renal calculi  negative genitourinary   Musculoskeletal negative musculoskeletal ROS (+)   Abdominal (+) + obese,   Peds  Hematology negative hematology ROS (+)   Anesthesia Other Findings   Reproductive/Obstetrics (+) Pregnancy Previous C/section x 2                            Anesthesia Physical Anesthesia Plan  ASA: II  Anesthesia Plan: Spinal   Post-op Pain Management:    Induction:   Airway Management Planned: Natural Airway  Additional Equipment:   Intra-op Plan:   Post-operative Plan:   Informed Consent: I have reviewed the patients History and Physical, chart, labs and discussed the procedure including the risks, benefits and alternatives for the proposed anesthesia with the patient or authorized representative who has indicated his/her understanding and acceptance.     Plan Discussed with: Anesthesiologist, CRNA and Surgeon  Anesthesia Plan Comments:        Anesthesia Quick Evaluation

## 2016-07-09 NOTE — H&P (Signed)
Paige Stafford is a 31 y.o. female presenting for rpt csection. OB History    Gravida Para Term Preterm AB Living   3 2 2     1    SAB TAB Ectopic Multiple Live Births           2     Past Medical History:  Diagnosis Date  . Heartburn in pregnancy   . Kidney stones    passsed stone - no surgery  . Postpartum care following cesarean delivery (9/23) 02/10/2013   Past Surgical History:  Procedure Laterality Date  . CESAREAN SECTION  2013   [facility] - 41 gestation fetal distress with fetal demise  . CESAREAN SECTION N/A 02/10/2013   Procedure: Repeat CESAREAN SECTION;  Surgeon: Paige Adenichard Stafford Ethelene Closser, MD;  Location: [facility];  Service: Obstetrics;  Laterality: N/A;  EDD: 02/17/13  . ESOPHAGOGASTRODUODENOSCOPY ENDOSCOPY    . repair perineal tear     at age 35 yrs old  . WISDOM TOOTH EXTRACTION  2012   Family History: family history includes Cancer in her mother. Social History:  reports that she has never smoked. She has never used smokeless tobacco. She reports that she does not drink alcohol or use drugs.     Maternal Diabetes: No Genetic Screening: Normal Maternal Ultrasounds/Referrals: Normal Fetal Ultrasounds or other Referrals:  None Maternal Substance Abuse:  No Significant Maternal Medications:  None Significant Maternal Lab Results:  None Other Comments:  None  Review of Systems  Constitutional: Negative.   All other systems reviewed and are negative.  Maternal Medical History:  Fetal activity: Perceived fetal activity is normal.   Last perceived fetal movement was within the past hour.    Prenatal complications: no prenatal complications Prenatal Complications - Diabetes: none.      Height 5\' 2"  (1.575 m), weight 78.9 kg (174 lb), last menstrual period 10/07/2015, unknown if currently breastfeeding. Maternal Exam:  Abdomen: Patient reports no abdominal tenderness. Surgical scars: low transverse.   Fetal presentation: vertex  Introitus: Normal vulva. Normal  vagina.  Ferning test: not done.  Amniotic fluid character: not assessed.  Pelvis: questionable for delivery.   Cervix: Cervix evaluated by digital exam.     Physical Exam  Constitutional: She is oriented to person, place, and time. She appears well-developed and well-nourished.  HENT:  Head: Normocephalic and atraumatic.  Neck: Normal range of motion. Neck supple.  Cardiovascular: Normal rate and regular rhythm.   Respiratory: Effort normal and breath sounds normal.  GI: Soft. Bowel sounds are normal.  Genitourinary: Vagina normal and uterus normal.  Musculoskeletal: Normal range of motion.  Neurological: She is alert and oriented to person, place, and time. She has normal reflexes.  Skin: Skin is warm and dry.  Psychiatric: She has a normal mood and affect.    Prenatal labs: ABO, Rh: --/--/A NEG (02/16 1140) Antibody: POS (02/16 1140) Rubella:   RPR: Non Reactive (02/16 1140)  HBsAg:    HIV:    GBS: Negative (01/24 0000)   Assessment/Plan: 39 week IUP History of Csection x 2 Rpt csection Consent done. Risks vs benefits discussed.   Paige Stafford 07/09/2016, 12:57 PM

## 2016-07-09 NOTE — Anesthesia Postprocedure Evaluation (Addendum)
Anesthesia Post Note  Patient: Paige Stafford  Procedure(s) Performed: Procedure(s) (LRB): Repeat CESAREAN SECTION (N/A)  Patient location during evaluation: PACU Anesthesia Type: Spinal Level of consciousness: oriented and awake and alert Pain management: pain level controlled Vital Signs Assessment: post-procedure vital signs reviewed and stable Respiratory status: spontaneous breathing, respiratory function stable and nonlabored ventilation Cardiovascular status: blood pressure returned to baseline and stable Postop Assessment: no headache, no backache, spinal receding, patient able to bend at knees and no signs of nausea or vomiting Anesthetic complications: no        Last Vitals:  Vitals:   07/09/16 1715 07/09/16 1719  BP:  105/65  Pulse: 64 67  Resp:  17  Temp:  36.9 C    Last Pain:  Vitals:   07/09/16 1719  TempSrc: Axillary  PainSc:    Pain Goal:                 Jalia Zuniga A.

## 2016-07-09 NOTE — Lactation Note (Signed)
This note was copied from a baby's chart. Lactation Consultation Note  Patient Name: Paige Stafford [id number]'XToday's Date: 07/09/2016 Reason for consult: Initial assessment Baby at 4 hr of life. Mom stated her older child was in the NICU then she had a forceful let down so she pumped for him. She thinks this baby is latching well. She denies breast or nipple pain, voiced no concerns. Discussed baby behavior, feeding frequency, baby belly size, voids, wt loss, breast changes, and nipple care. Mom stated she can manually express and has a spoon in the room. Given lactation handouts. Aware of OP services and support group. Mom will offer the breast on demand 8+/24hr and call lactation at the next feeding to observe a latch.    Maternal Data Has patient been taught Hand Expression?: Yes Does the patient have breastfeeding experience prior to this delivery?: Yes  Feeding Feeding Type: Breast Fed Length of feed: 60 min  LATCH Score/Interventions                      Lactation Tools Discussed/Used WIC Program: No   Consult Status Consult Status: Follow-up Date: 07/10/16 Follow-up type: In-patient    [name] 07/09/2016, 8:00 PM

## 2016-07-09 NOTE — Op Note (Addendum)
Cesarean Section Procedure Note  Indications: previous uterine incision kerr x2  Pre-operative Diagnosis: 39 week 3 day pregnancy.  Post-operative Diagnosis: same  Surgeon: Lenoard AdenAAVON,Ares Cardozo J   Assistants: RNFA  Anesthesia: Local anesthesia 0.25.% bupivacaine and Spinal anesthesia  ASA Class: 2  Procedure Details  The patient was seen in the Holding Room. The risks, benefits, complications, treatment options, and expected outcomes were discussed with the patient.  The patient concurred with the proposed plan, giving informed consent. The risks of anesthesia, infection, bleeding and possible injury to other organs discussed. Injury to bowel, bladder, or ureter with possible need for repair discussed. Possible need for transfusion with secondary risks of hepatitis or HIV acquisition discussed. Post operative complications to include but not limited to DVT, PE and Pneumonia noted. The site of surgery properly noted/marked. The patient was taken to Operating Room # 1, identified as Paige Stafford and the procedure verified as C-Section Delivery. A Time Out was held and the above information confirmed.  After induction of anesthesia, the patient was draped and prepped in the usual sterile manner. A Pfannenstiel incision was made and carried down through the subcutaneous tissue to the fascia. Fascial incision was made and extended transversely using Mayo scissors. The fascia was separated from the underlying rectus tissue superiorly and inferiorly. The peritoneum was identified and entered. Peritoneal incision was extended longitudinally. The utero-vesical peritoneal reflection was incised transversely and the bladder flap was bluntly freed from the lower uterine segment. A low transverse uterine incision(Kerr hysterotomy) was made. Delivered from OA presentation was a  female with Apgar scores of 8 at one minute and 9 at five minutes. Bulb suctioning gently performed. Neonatal team in attendance.After the  umbilical cord was clamped and cut cord blood was obtained for evaluation. The placenta was removed intact and appeared normal. The uterus was curetted with a dry lap pack. Good hemostasis was noted.The uterine outline, tubes and ovaries appeared normal. The uterine incision was closed with running locked sutures of 0 Monocryl x 2 layers. Hemostasis was observed. Lavage was carried out until clear.The parietal peritoneum was closed with a running 2-0 Monocryl suture. The fascia was then reapproximated with running sutures of 0 Monocryl. The skin was reapproximated with 4-0 vicryl after [place] closure with 2-0 plain.  Instrument, sponge, and needle counts were correct prior the abdominal closure and at the conclusion of the case.   Findings: FTLM, post placenta. Nl tubes and ovaries  Estimated Blood Loss:  600         Drains: foley                 Specimens: placenta                 Complications:  None; patient tolerated the procedure well.         Disposition: 600         Condition: stable  Attending Attestation: I performed the procedure.

## 2016-07-10 DIAGNOSIS — Z6791 Unspecified blood type, Rh negative: Secondary | ICD-10-CM

## 2016-07-10 DIAGNOSIS — O26899 Other specified pregnancy related conditions, unspecified trimester: Secondary | ICD-10-CM

## 2016-07-10 LAB — TYPE AND SCREEN
ABO/RH(D): A NEG
Antibody Screen: POSITIVE
DAT, IGG: NEGATIVE
UNIT DIVISION: 0
UNIT DIVISION: 0
Unit division: 0
Unit division: 0

## 2016-07-10 LAB — CBC
HEMATOCRIT: 31.6 % — AB (ref 36.0–46.0)
HEMOGLOBIN: 11.4 g/dL — AB (ref 12.0–15.0)
MCH: 35.4 pg — ABNORMAL HIGH (ref 26.0–34.0)
MCHC: 36.1 g/dL — ABNORMAL HIGH (ref 30.0–36.0)
MCV: 98.1 fL (ref 78.0–100.0)
Platelets: 109 10*3/uL — ABNORMAL LOW (ref 150–400)
RBC: 3.22 MIL/uL — AB (ref 3.87–5.11)
RDW: 13.4 % (ref 11.5–15.5)
WBC: 8.6 10*3/uL (ref 4.0–10.5)

## 2016-07-10 LAB — BIRTH TISSUE RECOVERY COLLECTION (PLACENTA DONATION)

## 2016-07-10 NOTE — Anesthesia Postprocedure Evaluation (Signed)
Anesthesia Post Note  Patient: Paige Stafford  Procedure(s) Performed: Procedure(s) (LRB): Repeat CESAREAN SECTION (N/A)  Anesthesia Type: Spinal Level of consciousness: awake Pain management: pain level controlled Vital Signs Assessment: post-procedure vital signs reviewed and stable Respiratory status: spontaneous breathing Cardiovascular status: stable Postop Assessment: no headache, no backache and spinal receding Anesthetic complications: no        Last Vitals:  Vitals:   07/10/16 0530 07/10/16 0932  BP: (!) 101/58 107/61  Pulse: 72 (!) 58  Resp: 18 20  Temp: 36.7 C 36.7 C    Last Pain:  Vitals:   07/10/16 0932  TempSrc: Oral  PainSc: 0-No pain   Pain Goal:                 Edison Pace

## 2016-07-10 NOTE — Addendum Note (Signed)
Addendum  created 07/10/16 0953 by Earmon PhoenixValerie P Darcel Zick, CRNA   Sign clinical note

## 2016-07-10 NOTE — Progress Notes (Addendum)
Post Op Day #1  Subjective: Information for the patient's newborn:  Paige Stafford, Boy Vernona RiegerLaura [[id number]][[id number]]  female   Baby name: Deckard Feeding: breast - going well, BF first child and feels comfortable.  Has been seen by lactation consultant - No concerns at this time.   Reports feeling very well.  Not feeling any pain. Patient reports tolerating PO.   Breast symptoms:none Pain controlled withibuprofen. Denies HA/SOB/CP/N/V/dizziness. Scopolamine patch behind left ear. Flatus none yet, but feels it will be soon. No BM. She reports vaginal bleeding as normal, without clots.  She is ambulating, without assistance. Urinating without difficulty.     Objective:   VS:    Vitals:   07/09/16 2030 07/09/16 2100 07/10/16 0130 07/10/16 0530  BP: (!) 107/55  (!) 100/51 (!) 101/58  Pulse: 68  66 72  Resp: 18 18 18 18   Temp: 98.5 F (36.9 C) 97.6 F (36.4 C) 98.7 F (37.1 C) 98.1 F (36.7 C)  TempSrc: Oral Oral Oral Oral  SpO2: 100% 100% 98% 98%  Weight:      Height:         Intake/Output Summary (Last 24 hours) at 07/10/16 [id number] Last data filed at 07/10/16 [id number]  Gross per 24 hour  Intake             2500 ml  Output             1975 ml  Net              525 ml        Recent Labs  07/09/16 1306 07/10/16 0631  WBC 9.4 8.6  HGB 12.5 11.4*  HCT 34.6* 31.6*  PLT 129* 109*     Blood type: --/--/A NEG (02/16 1140)  Rubella:   immune    Physical Exam:  General: alert, cooperative and no distress CV: Regular rate and rhythm, S1S2 present or without murmur or extra heart sounds Resp: clear, bilaterally Abdomen: soft, nontender, normal bowel sounds Incision: dry, intact and bloody drainage present on right side of dress, borders marked Uterine Fundus: firm, below umbilicus, nontender Lochia: minimal Ext: Homans sign is negative, no sign of DVT and no edema, redness or tenderness in the calves or thighs   Assessment/Plan: 31 y.o.   POD# 1. [id number]                  Principal  Problem:   Postpartum care following cesarean delivery (2/19)  Encourage rest when baby rests  Breastfeeding support contact lactation consultant if needing further assistance.   Encourage to ambulate  Continue taking ibuprofen for pain and inflammation.   Routine post-op care  Active Problems:   Previous cesarean delivery affecting pregnancy   Rh negative, maternal - Newborn Rh neg  Rhophylac not indicated  Thrombocytopenia, idiopathic   No evidence of hemorrhage  Routine PP care  Doing well, stable.         Anticipated DC home tomorrow.    Kathlene Novemberinthya Bowmaker-Kareem, BSN, SNM 07/10/2016, 9:22 AM   Medical screening examination/treatment/procedure(s) were conducted as a shared visit with non-physician practitioner(s) and myself.  I personally evaluated the patient during the encounter.  Neta MendsDaniela C Paul, CNM 07/10/2016  9:55 AM

## 2016-07-11 ENCOUNTER — Encounter (HOSPITAL_COMMUNITY): Payer: Self-pay | Admitting: Obstetrics and Gynecology

## 2016-07-11 MED ORDER — IBUPROFEN 600 MG PO TABS: 600.0000 mg | ORAL_TABLET | Freq: Four times a day (QID) | ORAL | 0 refills | Status: AC

## 2016-07-11 MED ORDER — OXYCODONE-ACETAMINOPHEN 5-325 MG PO TABS: 1.0000 | ORAL_TABLET | ORAL | 0 refills | Status: AC | PRN

## 2016-07-11 NOTE — Discharge Summary (Signed)
OB Discharge Summary     Patient Name: Paige Stafford DOB: 02-08-1986 MRN: [id number]  Date of admission: 07/09/2016 Delivering MD: Olivia Mackie   Date of discharge: 07/11/2016  Admitting diagnosis: Previous Cesarean Section Intrauterine pregnancy: [redacted]w[redacted]d     Secondary diagnosis:  Principal Problem:   Postpartum care following cesarean delivery (2/19) Active Problems:   Previous cesarean delivery affecting pregnancy   Rh negative, maternal - Newborn Rh neg  Additional problems: none     Discharge diagnosis: Term Pregnancy Delivered                                                                                                Post partum procedures:n/a  Augmentation: n/a  Complications: None  Hospital course:  Sceduled C/S   31 y.o. yo [id number] at [redacted]w[redacted]d was admitted to the hospital 07/09/2016 for scheduled cesarean section with the following indication:Elective Repeat.  Membrane Rupture Time/Date: 3:04 PM ,07/09/2016   Patient delivered a Viable infant.07/09/2016  Details of operation can be found in separate operative note.  Pateint had an uncomplicated postpartum course.  She is ambulating, tolerating a regular diet, passing flatus, and urinating well. Patient is discharged home in stable condition on  07/15/16         Physical exam  Vitals:   07/10/16 0932 07/10/16 1324 07/10/16 1738 07/11/16 0547  BP: 107/61 (!) 100/56 (!) 111/58 110/67  Pulse: (!) 58 60 63 73  Resp: 20 20 16 17   Temp: 98.1 F (36.7 C) 98.4 F (36.9 C) 97.6 F (36.4 C) 98 F (36.7 C)  TempSrc: Oral Oral Oral Oral  SpO2: 100% 99% 98%   Weight:      Height:       General: alert, cooperative and no distress Lochia: appropriate Uterine Fundus: firm Incision: Healing well with no significant drainage, No significant erythema, D/I, dried serous drainage with marked borders, skin well-approximated with suture DVT Evaluation: No evidence of DVT seen on physical exam. Negative Homan's sign. No cords or  calf tenderness. No significant calf/ankle edema. Labs: Lab Results  Component Value Date   WBC 8.6 07/10/2016   HGB 11.4 (L) 07/10/2016   HCT 31.6 (L) 07/10/2016   MCV 98.1 07/10/2016   PLT 109 (L) 07/10/2016   CMP Latest Ref Rng & Units 03/26/2012  Glucose 65 - 99 mg/dL 147(W)  BUN 7 - 18 mg/dL 17  Creatinine 2.95 - 6.21 mg/dL 3.08  Sodium 657 - 846 mmol/L 139  Potassium 3.5 - 5.1 mmol/L 3.6  Chloride 98 - 107 mmol/L 104  CO2 21 - 32 mmol/L 21  Calcium 8.5 - 10.1 mg/dL 9.8    Discharge instruction: per After Visit Summary and "Baby and Me Booklet".  After visit meds:  Allergies as of 07/11/2016      Reactions   Hydrocodone Nausea Only   Latex Rash      Medication List    TAKE these medications   calcium carbonate 500 MG chewable tablet Commonly known as:  TUMS - dosed in mg elemental calcium Chew 1-2 tablets by mouth every 6 (six) hours as  needed for indigestion or heartburn.   ibuprofen 600 MG tablet Commonly known as:  ADVIL,MOTRIN Take 1 tablet (600 mg total) by mouth every 6 (six) hours.   multivitamin-prenatal 27-0.8 MG Tabs tablet Take 1 tablet by mouth daily at 12 noon.   oxyCODONE-acetaminophen 5-325 MG tablet Commonly known as:  PERCOCET/ROXICET Take 1 tablet by mouth every 4 (four) hours as needed (pain scale 4-7).       Diet: routine diet  Activity: Advance as tolerated. Pelvic rest for 6 weeks.   Outpatient follow up:6 weeks Follow up Appt:No future appointments. Follow up Visit:No Follow-up on file.  Postpartum contraception: Undecided  Newborn Data: Live born female "Deckard" on 07/09/2016 Birth Weight: 8 lb 5.3 oz (3780 g) APGAR: 9, 9  Baby Feeding: Breast Disposition:home with mother   07/11/2016 Raelyn MoraAWSON, Kinaya Hilliker, Judie PetitM, CNM

## 2016-07-11 NOTE — Discharge Instructions (Signed)
Postpartum Depression and Baby Blues °The postpartum period begins right after the birth of a baby. During this time, there is often a great amount of joy and excitement. It is also a time of many changes in the life of the parents. Regardless of how many times a mother gives birth, each child brings new challenges and dynamics to the family. It is not unusual to have feelings of excitement along with confusing shifts in moods, emotions, and thoughts. All mothers are at risk of developing postpartum depression or the "baby blues." These mood changes can occur right after giving birth, or they may occur many months after giving birth. The baby blues or postpartum depression can be mild or severe. Additionally, postpartum depression can go away rather quickly, or it can be a long-term condition. °What are the causes? °Raised hormone levels and the rapid drop in those levels are thought to be a main cause of postpartum depression and the baby blues. A number of hormones change during and after pregnancy. Estrogen and progesterone usually decrease right after the delivery of your baby. The levels of thyroid hormone and various cortisol steroids also rapidly drop. Other factors that play a role in these mood changes include major life events and genetics. °What increases the risk? °If you have any of the following risks for the baby blues or postpartum depression, know what symptoms to watch out for during the postpartum period. Risk factors that may increase the likelihood of getting the baby blues or postpartum depression include: °· Having a personal or family history of depression. °· Having depression while being pregnant. °· Having premenstrual mood issues or mood issues related to oral contraceptives. °· Having a lot of life stress. °· Having marital conflict. °· Lacking a social support network. °· Having a baby with special needs. °· Having health problems, such as diabetes. °What are the signs or  symptoms? °Symptoms of baby blues include: °· Brief changes in mood, such as going from extreme happiness to sadness. °· Decreased concentration. °· Difficulty sleeping. °· Crying spells, tearfulness. °· Irritability. °· Anxiety. °Symptoms of postpartum depression typically begin within the first month after giving birth. These symptoms include: °· Difficulty sleeping or excessive sleepiness. °· Marked weight loss. °· Agitation. °· Feelings of worthlessness. °· Lack of interest in activity or food. °Postpartum psychosis is a very serious condition and can be dangerous. Fortunately, it is rare. Displaying any of the following symptoms is cause for immediate medical attention. Symptoms of postpartum psychosis include: °· Hallucinations and delusions. °· Bizarre or disorganized behavior. °· Confusion or disorientation. °How is this diagnosed? °A diagnosis is made by an evaluation of your symptoms. There are no medical or lab tests that lead to a diagnosis, but there are various questionnaires that a health care provider may use to identify those with the baby blues, postpartum depression, or psychosis. Often, a screening tool called the Edinburgh Postnatal Depression Scale is used to diagnose depression in the postpartum period. °How is this treated? °The baby blues usually goes away on its own in 1-2 weeks. Social support is often all that is needed. You will be encouraged to get adequate sleep and rest. Occasionally, you may be given medicines to help you sleep. °Postpartum depression requires treatment because it can last several months or longer if it is not treated. Treatment may include individual or group therapy, medicine, or both to address any social, physiological, and psychological factors that may play a role in the depression. Regular exercise, a   healthy diet, rest, and social support may also be strongly recommended. °Postpartum psychosis is more serious and needs treatment right away. Hospitalization is  often needed. °Follow these instructions at home: °· Get as much rest as you can. Nap when the baby sleeps. °· Exercise regularly. Some women find yoga and walking to be beneficial. °· Eat a balanced and nourishing diet. °· Do little things that you enjoy. Have a cup of tea, take a bubble bath, read your favorite magazine, or listen to your favorite music. °· Avoid alcohol. °· Ask for help with household chores, cooking, grocery shopping, or running errands as needed. Do not try to do everything. °· Talk to people close to you about how you are feeling. Get support from your partner, family members, friends, or other new moms. °· Try to stay positive in how you think. Think about the things you are grateful for. °· Do not spend a lot of time alone. °· Only take over-the-counter or prescription medicine as directed by your health care provider. °· Keep all your postpartum appointments. °· Let your health care provider know if you have any concerns. °Contact a health care provider if: °You are having a reaction to or problems with your medicine. °Get help right away if: °· You have suicidal feelings. °· You think you may harm the baby or someone else. °This information is not intended to replace advice given to you by your health care provider. Make sure you discuss any questions you have with your health care provider. °Document Released: 02/09/2004 Document Revised: 10/13/2015 Document Reviewed: 02/16/2013 °Elsevier Interactive Patient Education © 2017 [employer] °Cesarean Delivery, Care After °Refer to this sheet in the next few weeks. These instructions provide you with information about caring for yourself after your procedure. Your health care provider may also give you more specific instructions. Your treatment has been planned according to current medical practices, but problems sometimes occur. Call your health care provider if you have any problems or questions after your procedure. °What can I expect  after the procedure? °After the procedure, it is common to have: °· A small amount of blood or clear fluid coming from the incision. °· Some redness, swelling, and pain in your incision area. °· Some abdominal pain and soreness. °· Vaginal bleeding (lochia). °· Pelvic cramps. °· Fatigue. °Follow these instructions at home: °Incision care °· Follow instructions from your health care provider about how to take care of your incision. Make sure you: °¨ Wash your hands with soap and water before you change your bandage (dressing). If soap and water are not available, use hand sanitizer. °¨ Change your dressing as told by your health care provider. °¨ Leave stitches (sutures), skin staples, skin glue, or adhesive strips in place. These skin closures may need to stay in place for 2 weeks or longer. If adhesive strip edges start to loosen and curl up, you may trim the loose edges. Do not remove adhesive strips completely unless your health care provider tells you to do that. °· Check your incision area every day for signs of infection. Check for: °¨ More redness, swelling, or pain. °¨ More fluid or blood. °¨ Warmth. °¨ Pus or a bad smell. °· When you cough or sneeze, hug a pillow. This helps with pain and decreases the chance of your incision opening up (dehiscing). Do this until your incision heals. °Medicines °· Take over-the-counter and prescription medicines only as told by your health care provider. °· If you were prescribed   an antibiotic medicine, take it as told by your health care provider. Do not stop taking the antibiotic until it is finished. °Driving °· Do not drive or operate heavy machinery while taking prescription pain medicine. °· Do not drive for 24 hours if you received a sedative. °Lifestyle °· Do not drink alcohol. This is especially important if you are breastfeeding or taking pain medicine. °· Do not use tobacco products, including cigarettes, chewing tobacco, or e-cigarettes. If you need help  quitting, ask your health care provider. Tobacco can delay wound healing. °Eating and drinking °· Drink at least 8 eight-ounce glasses of water every day unless told not to by your health care provider. If you breastfeed, you may need to drink more water than this. °· Eat high-fiber foods every day. These foods may help prevent or relieve constipation. High-fiber foods include: °¨ Whole grain cereals and breads. °¨ Brown rice. °¨ Beans. °¨ Fresh fruits and vegetables. °Activity °· Return to your normal activities as told by your health care provider. Ask your health care provider what activities are safe for you. °· Rest as much as possible. Try to rest or take a nap while your baby is sleeping. °· Do not lift anything that is heavier than your baby or 10 lb (4.5 kg) as told by your health care provider. °· Ask your health care provider when you can engage in sexual activity. This may depend on your: °¨ Risk of infection. °¨ Healing rate. °¨ Comfort and desire to engage in sexual activity. °Bathing °· Do not take baths, swim, or use a hot tub until your health care provider approves. Ask your health care provider if you can take showers. You may only be allowed to take sponge baths until your incision heals. °· Keep your dressing dry as told by your health care provider. °General instructions °· Do not use tampons or douches until your health care provider approves. °· Wear: °¨ Loose, comfortable clothing. °¨ A supportive and well-fitting bra. °· Watch for any blood clots that may pass from your vagina. These may look like clumps of dark red, brown, or black discharge. °· Keep your perineum clean and dry as told by your health care provider. °· Wipe from front to back when you use the toilet. °· If possible, have someone help you care for your baby and help with household activities for a few days after you leave the hospital. °· Keep all follow-up visits for you and your baby as told by your health care provider.  This is important. °Contact a health care provider if: °· You have: °¨ Bad-smelling vaginal discharge. °¨ Difficulty urinating. °¨ Pain when urinating. °¨ A sudden increase or decrease in the frequency of your bowel movements. °¨ More redness, swelling, or pain around your incision. °¨ More fluid or blood coming from your incision. °¨ Pus or a bad smell coming from your incision. °¨ A fever. °¨ A rash. °¨ Little or no interest in activities you used to enjoy. °¨ Questions about caring for yourself or your baby. °¨ Nausea. °· Your incision feels warm to the touch. °· Your breasts turn red or become painful or hard. °· You feel unusually sad or worried. °· You vomit. °· You pass large blood clots from your vagina. If you pass a blood clot, save it to show to your health care provider. Do not flush blood clots down the toilet without showing your health care provider. °· You urinate more than usual. °· You   are dizzy or light-headed. °· You have not breastfed and have not had a menstrual period for 12 weeks after delivery. °· You stopped breastfeeding and have not had a menstrual period for 12 weeks after stopping breastfeeding. °Get help right away if: °· You have: °¨ Pain that does not go away or get better with medicine. °¨ Chest pain. °¨ Difficulty breathing. °¨ Blurred vision or spots in your vision. °¨ Thoughts about hurting yourself or your baby. °¨ New pain in your abdomen or in one of your legs. °¨ A severe headache. °· You faint. °· You bleed from your vagina so much that you fill two sanitary pads in one hour. °This information is not intended to replace advice given to you by your health care provider. Make sure you discuss any questions you have with your health care provider. °Document Released: 01/27/2002 Document Revised: 09/15/2015 Document Reviewed: 04/11/2015 °Elsevier Interactive Patient Education © 2017 [employer] °Breastfeeding and Mastitis °Mastitis is inflammation of the breast tissue. It can  occur in women who are breastfeeding. This can make breastfeeding painful. Mastitis will sometimes go away on its own. Your health care provider will help determine if treatment is needed. °CAUSES °Mastitis is often associated with a blocked milk (lactiferous) duct. This can happen when too much milk builds up in the breast. Causes of excess milk in the breast can include: °· Poor latch-on. If your baby is not latched onto the breast properly, she or he may not empty your breast completely while breastfeeding. °· Allowing too much time to pass between feedings. °· Wearing a bra or other clothing that is too tight. This puts extra pressure on the lactiferous ducts so milk does not flow through them as it should. °Mastitis can also be caused by a bacterial infection. Bacteria may enter the breast tissue through cuts or openings in the skin. In women who are breastfeeding, this may occur because of cracked or irritated skin. Cracks in the skin are often caused when your baby does not latch on properly to the breast. °SIGNS AND SYMPTOMS °· Swelling, redness, tenderness, and pain in an area of the breast. °· Swelling of the glands under the arm on the same side. °· Fever may or may not accompany mastitis. °If an infection is allowed to progress, a collection of pus (abscess) may develop. °DIAGNOSIS  °Your health care provider can usually diagnose mastitis based on your symptoms and a physical exam. Tests may be done to help confirm the diagnosis. These may include: °· Removal of pus from the breast by applying pressure to the area. This pus can be examined in the lab to determine which bacteria are present. If an abscess has developed, the fluid in the abscess can be removed with a needle. This can also be used to confirm the diagnosis and determine the bacteria present. In most cases, pus will not be present. °· Blood tests to determine if your body is fighting a bacterial infection. °· Mammogram or ultrasound tests to  rule out other problems or diseases. °TREATMENT  °Mastitis that occurs with breastfeeding will sometimes go away on its own. Your health care provider may choose to wait 24 hours after first seeing you to decide whether a prescription medicine is needed. If your symptoms are worse after 24 hours, your health care provider will likely prescribe an antibiotic medicine to treat the mastitis. He or she will determine which bacteria are most likely causing the infection and will then select an appropriate   antibiotic medicine. This is sometimes changed based on the results of tests performed to identify the bacteria, or if there is no response to the antibiotic medicine selected. Antibiotic medicines are usually given by mouth. You may also be given medicine for pain. HOME CARE INSTRUCTIONS  Only take over-the-counter or prescription medicines for pain, fever, or discomfort as directed by your health care provider.  If your health care provider prescribed an antibiotic medicine, take the medicine as directed. Make sure you finish it even if you start to feel better.  Do not wear a tight or underwire bra. Wear a soft, supportive bra.  Increase your fluid intake, especially if you have a fever.  Continue to empty the breast. Your health care provider can tell you whether this milk is safe for your infant or needs to be thrown out. You may be told to stop nursing until your health care provider thinks it is safe for your baby. Use a breast pump if you are advised to stop nursing.  Keep your nipples clean and dry.  Empty the first breast completely before going to the other breast. If your baby is not emptying your breasts completely for some reason, use a breast pump to empty your breasts.  If you go back to work, pump your breasts while at work to stay in time with your nursing schedule.  Avoid allowing your breasts to become overly filled with milk (engorged). SEEK MEDICAL CARE IF:  You have pus-like  discharge from the breast.  Your symptoms do not improve with the treatment prescribed by your health care provider within 2 days. SEEK IMMEDIATE MEDICAL CARE IF:  Your pain and swelling are getting worse.  You have pain that is not controlled with medicine.  You have a red line extending from the breast toward your armpit.  You have a fever or persistent symptoms for more than 2-3 days.  You have a fever and your symptoms suddenly get worse. MAKE SURE YOU:   Understand these instructions.  Will watch your condition.  Will get help right away if you are not doing well or get worse. This information is not intended to replace advice given to you by your health care provider. Make sure you discuss any questions you have with your health care provider. Document Released: 09/01/2004 Document Revised: 05/12/2013 Document Reviewed: 12/11/2012 Elsevier Interactive Patient Education  2017 [employer]. Breastfeeding Deciding to breastfeed is one of the best choices you can make for you and your baby. A change in hormones during pregnancy causes your breast tissue to grow and increases the number and size of your milk ducts. These hormones also allow proteins, sugars, and fats from your blood supply to make breast milk in your milk-producing glands. Hormones prevent breast milk from being released before your baby is born as well as prompt milk flow after birth. Once breastfeeding has begun, thoughts of your baby, as well as his or her sucking or crying, can stimulate the release of milk from your milk-producing glands. Benefits of breastfeeding For Your Baby  Your first milk (colostrum) helps your baby's digestive system function better.  There are antibodies in your milk that help your baby fight off infections.  Your baby has a lower incidence of asthma, allergies, and sudden infant death syndrome.  The nutrients in breast milk are better for your baby than infant formulas and are  designed uniquely for your babys needs.  Breast milk improves your baby's brain development.  Your baby is  less likely to develop other conditions, such as childhood obesity, asthma, or type 2 diabetes mellitus. °For You °· Breastfeeding helps to create a very special bond between you and your baby. °· Breastfeeding is convenient. Breast milk is always available at the correct temperature and costs nothing. °· Breastfeeding helps to burn calories and helps you lose the weight gained during pregnancy. °· Breastfeeding makes your uterus contract to its prepregnancy size faster and slows bleeding (lochia) after you give birth. °· Breastfeeding helps to lower your risk of developing type 2 diabetes mellitus, osteoporosis, and breast or ovarian cancer later in life. °Signs that your baby is hungry °Early Signs of Hunger °· Increased alertness or activity. °· Stretching. °· Movement of the head from side to side. °· Movement of the head and opening of the mouth when the corner of the mouth or cheek is stroked (rooting). °· Increased sucking sounds, smacking lips, cooing, sighing, or squeaking. °· Hand-to-mouth movements. °· Increased sucking of fingers or hands. °Late Signs of Hunger °· Fussing. °· Intermittent crying. °Extreme Signs of Hunger  °Signs of extreme hunger will require calming and consoling before your baby will be able to breastfeed successfully. Do not wait for the following signs of extreme hunger to occur before you initiate breastfeeding: °· Restlessness. °· A loud, strong cry. °· Screaming. °Breastfeeding basics ° Breastfeeding Initiation °· Find a comfortable place to sit or lie down, with your neck and back well supported. °· Place a pillow or rolled up blanket under your baby to bring him or her to the level of your breast (if you are seated). Nursing pillows are specially designed to help support your arms and your baby while you breastfeed. °· Make sure that your baby's abdomen is facing your  abdomen. °· Gently massage your breast. With your fingertips, massage from your chest wall toward your nipple in a circular motion. This encourages milk flow. You may need to continue this action during the feeding if your milk flows slowly. °· Support your breast with 4 fingers underneath and your thumb above your nipple. Make sure your fingers are well away from your nipple and your baby’s mouth. °· Stroke your baby's lips gently with your finger or nipple. °· When your baby's mouth is open wide enough, quickly bring your baby to your breast, placing your entire nipple and as much of the colored area around your nipple (areola) as possible into your baby's mouth. °¨ More areola should be visible above your baby's upper lip than below the lower lip. °¨ Your baby's tongue should be between his or her lower gum and your breast. °· Ensure that your baby's mouth is correctly positioned around your nipple (latched). Your baby's lips should create a seal on your breast and be turned out (everted). °· It is common for your baby to suck about 2-3 minutes in order to start the flow of breast milk. °Latching  °Teaching your baby how to latch on to your breast properly is very important. An improper latch can cause nipple pain and decreased milk supply for you and poor weight gain in your baby. Also, if your baby is not latched onto your nipple properly, he or she may swallow some air during feeding. This can make your baby fussy. Burping your baby when you switch breasts during the feeding can help to get rid of the air. However, teaching your baby to latch on properly is still the best way to prevent fussiness from swallowing air while breastfeeding. °  Signs that your baby has successfully latched on to your nipple:  Silent tugging or silent sucking, without causing you pain.  Swallowing heard between every 3-4 sucks.  Muscle movement above and in front of his or her ears while sucking. Signs that your baby has not  successfully latched on to nipple:  Sucking sounds or smacking sounds from your baby while breastfeeding.  Nipple pain. If you think your baby has not latched on correctly, slip your finger into the corner of your babys mouth to break the suction and place it between your baby's gums. Attempt breastfeeding initiation again. Signs of Successful Breastfeeding  Signs from your baby:  A gradual decrease in the number of sucks or complete cessation of sucking.  Falling asleep.  Relaxation of his or her body.  Retention of a small amount of milk in his or her mouth.  Letting go of your breast by himself or herself. Signs from you:  Breasts that have increased in firmness, weight, and size 1-3 hours after feeding.  Breasts that are softer immediately after breastfeeding.  Increased milk volume, as well as a change in milk consistency and color by the fifth day of breastfeeding.  Nipples that are not sore, cracked, or bleeding. Signs That Your [employer] is Getting Enough Milk  Wetting at least 1-2 diapers during the first 24 hours after birth.  Wetting at least 5-6 diapers every 24 hours for the first week after birth. The urine should be clear or pale yellow by 5 days after birth.  Wetting 6-8 diapers every 24 hours as your baby continues to grow and develop.  At least 3 stools in a 24-hour period by age 39 days. The stool should be soft and yellow.  At least 3 stools in a 24-hour period by age 134 days. The stool should be seedy and yellow.  No loss of weight greater than 10% of birth weight during the first 57 days of age.  Average weight gain of 4-7 ounces (113-198 g) per week after age 13 days.  Consistent daily weight gain by age 394 days, without weight loss after the age of 2 weeks. After a feeding, your baby may spit up a small amount. This is common. Breastfeeding frequency and duration Frequent feeding will help you make more milk and can prevent sore nipples and breast  engorgement. Breastfeed when you feel the need to reduce the fullness of your breasts or when your baby shows signs of hunger. This is called "breastfeeding on demand." Avoid introducing a pacifier to your baby while you are working to establish breastfeeding (the first 4-6 weeks after your baby is born). After this time you may choose to use a pacifier. Research has shown that pacifier use during the first year of a baby's life decreases the risk of sudden infant death syndrome (SIDS). Allow your baby to feed on each breast as long as he or she wants. Breastfeed until your baby is finished feeding. When your baby unlatches or falls asleep while feeding from the first breast, offer the second breast. Because newborns are often sleepy in the first few weeks of life, you may need to awaken your baby to get him or her to feed. Breastfeeding times will vary from baby to baby. However, the following rules can serve as a guide to help you ensure that your baby is properly fed:  Newborns (babies 59 weeks of age or younger) may breastfeed every 1-3 hours.  Newborns should not go longer than 3  hours during the day or 5 hours during the night without breastfeeding. °· You should breastfeed your baby a minimum of 8 times in a 24-hour period until you begin to introduce solid foods to your baby at around 6 months of age. °Breast milk pumping °Pumping and storing breast milk allows you to ensure that your baby is exclusively fed your breast milk, even at times when you are unable to breastfeed. This is especially important if you are going back to work while you are still breastfeeding or when you are not able to be present during feedings. Your lactation consultant can give you guidelines on how long it is safe to store breast milk. °A breast pump is a machine that allows you to pump milk from your breast into a sterile bottle. The pumped breast milk can then be stored in a refrigerator or freezer. Some breast pumps are  operated by hand, while others use electricity. Ask your lactation consultant which type will work best for you. Breast pumps can be purchased, but some hospitals and breastfeeding support groups lease breast pumps on a monthly basis. A lactation consultant can teach you how to hand express breast milk, if you prefer not to use a pump. °Caring for your breasts while you breastfeed °Nipples can become dry, cracked, and sore while breastfeeding. The following recommendations can help keep your breasts moisturized and healthy: °· Avoid using soap on your nipples. °· Wear a supportive bra. Although not required, special nursing bras and tank tops are designed to allow access to your breasts for breastfeeding without taking off your entire bra or top. Avoid wearing underwire-style bras or extremely tight bras. °· Air dry your nipples for 3-4 minutes after each feeding. °· Use only cotton bra pads to absorb leaked breast milk. Leaking of breast milk between feedings is normal. °· Use lanolin on your nipples after breastfeeding. Lanolin helps to maintain your skin's normal moisture barrier. If you use pure lanolin, you do not need to wash it off before feeding your baby again. Pure lanolin is not toxic to your baby. You may also hand express a few drops of breast milk and gently massage that milk into your nipples and allow the milk to air dry. °In the first few weeks after giving birth, some women experience extremely full breasts (engorgement). Engorgement can make your breasts feel heavy, warm, and tender to the touch. Engorgement peaks within 3-5 days after you give birth. The following recommendations can help ease engorgement: °· Completely empty your breasts while breastfeeding or pumping. You may want to start by applying warm, moist heat (in the shower or with warm water-soaked hand towels) just before feeding or pumping. This increases circulation and helps the milk flow. If your baby does not completely empty  your breasts while breastfeeding, pump any extra milk after he or she is finished. °· Wear a snug bra (nursing or regular) or tank top for 1-2 days to signal your body to slightly decrease milk production. °· Apply ice packs to your breasts, unless this is too uncomfortable for you. °· Make sure that your baby is latched on and positioned properly while breastfeeding. °If engorgement persists after 48 hours of following these recommendations, contact your health care provider or a lactation consultant. °Overall health care recommendations while breastfeeding °· Eat healthy foods. Alternate between meals and snacks, eating 3 of each per day. Because what you eat affects your breast milk, some of the foods may make your baby more irritable than   usual. Avoid eating these foods if you are sure that they are negatively affecting your baby.  Drink milk, fruit juice, and water to satisfy your thirst (about 10 glasses a day).  Rest often, relax, and continue to take your prenatal vitamins to prevent fatigue, stress, and anemia.  Continue breast self-awareness checks.  Avoid chewing and smoking tobacco. Chemicals from cigarettes that pass into breast milk and exposure to secondhand smoke may harm your baby.  Avoid alcohol and drug use, including marijuana. Some medicines that may be harmful to your baby can pass through breast milk. It is important to ask your health care provider before taking any medicine, including all over-the-counter and prescription medicine as well as vitamin and herbal supplements. It is possible to become pregnant while breastfeeding. If birth control is desired, ask your health care provider about options that will be safe for your baby. Contact a health care provider if:  You feel like you want to stop breastfeeding or have become frustrated with breastfeeding.  You have painful breasts or nipples.  Your nipples are cracked or bleeding.  Your breasts are red, tender, or  warm.  You have a swollen area on either breast.  You have a fever or chills.  You have nausea or vomiting.  You have drainage other than breast milk from your nipples.  Your breasts do not become full before feedings by the fifth day after you give birth.  You feel sad and depressed.  Your baby is too sleepy to eat well.  Your baby is having trouble sleeping.  Your baby is wetting less than 3 diapers in a 24-hour period.  Your baby has less than 3 stools in a 24-hour period.  Your baby's skin or the white part of his or her eyes becomes yellow.  Your baby is not gaining weight by 12 days of age. Get help right away if:  Your baby is overly tired (lethargic) and does not want to wake up and feed.  Your baby develops an unexplained fever. This information is not intended to replace advice given to you by your health care provider. Make sure you discuss any questions you have with your health care provider. Document Released: 05/07/2005 Document Revised: 10/19/2015 Document Reviewed: 10/29/2012 Elsevier Interactive Patient Education  2017 [place] Tips If you are breastfeeding, there may be times when you cannot feed your baby directly. Returning to work or going on a trip are examples. Pumping allows you to store breast milk and feed it to your baby later. You may not get much milk when you first start to pump. Your breasts should start to make more after a few days. If you pump at the times you usually feed your baby, you may be able to keep making enough milk to feed your baby without also using formula. The more often you pump, the more milk your body will make. When should I pump?  You can start to pump soon after you have your baby. Ask your doctor what is right for you and your baby.  If you are going back to work, start pumping a few weeks before. This gives you time to learn how to pump and to store a supply of milk.  When you are with your baby,  feed your baby when he or she is hungry. Pump after each feeding.  When you are away from your baby for many hours, pump for about 15 minutes every 2-3 hours. Pump both breasts at  the same time if you can.  If your baby has a formula feeding, make sure to pump close to the same time.  If you drink any alcohol, wait 2 hours before pumping. How do I get ready to pump? Your let-down reflex is your body's natural reaction that makes your breast milk flow. It is easier to make your breast milk flow when you are relaxed. Try these things to help you relax:  Smell one of your baby's blankets or an item of clothing.  Look at a picture or video of your baby.  Sit in a quiet, private space.  Massage the breast you plan to pump.  Place soothing warmth on the breast.  Play relaxing music. What are some breast pumping tips?  Wash your hands before you pump. You do not need to wash your nipples or breasts.  There are three ways to pump. You can:  Use your hand to massage and squeeze your breast.  Use a handheld manual pump.  Use an electric pump.  Make sure the suction cup on the breast pump is the right size. Place the suction cup directly over the nipple. It can be painful or hurt your nipple if it is the wrong size or placed wrong.  Put a small amount of purified or modified lanolin on your nipple and areola if you are sore.  If you are using an electric pump, change the speed and suction power to be more comfortable.  You may need a different type of pump if pumping hurts or you do not get a lot of milk. Your doctor can help you pick what type of pump to use.  Keep a full water bottle near you always. Drinking lots of fluid helps you make more milk.  You can store your milk to use later. Pumped breast milk can be stored in a sealable, sterile container or plastic bag. Always put the date you pumped it on the container.  Milk can stay out at room temperature for up to 8  hours.  You can store your milk in the refrigerator for up to 8 days.  You can store your milk in the freezer for 3 months. Thaw frozen milk using warm water. Do not put it in the microwave.  Do not smoke. Ask your doctor for help. When should I call my doctor?  You have a hard time pumping.  You are worried you do not make enough milk.  You have nipple pain, soreness, or redness.  You want to take birth control pills. This information is not intended to replace advice given to you by your health care provider. Make sure you discuss any questions you have with your health care provider. Document Released: 10/24/2007 Document Revised: 10/13/2015 Document Reviewed: 02/27/2013 Elsevier Interactive Patient Education  2017 [employer]

## 2016-07-11 NOTE — Addendum Note (Signed)
Addendum  created 07/11/16 1125 by Mal AmabileMichael Kit Mollett, MD   Anesthesia Intra Blocks edited, Child order released for a procedure order, Sign clinical note, SmartForm saved

## 2016-07-11 NOTE — Progress Notes (Signed)
POD #2: S/P Elective Repeat Cesarean Delivery   Subjective: Information for the patient's newborn:  Belia Hemanall, Boy Vernona RiegerLaura [[id number]][[id number]]  female   Baby name: "Deckard" Feeding: breast - going well, BF first child and feels comfortable.  Has been seen by lactation consultant - No concerns at this time.   Reports feeling very well.  Not feeling any pain. Patient reports tolerating PO.   Breast symptoms:none Pain controlled withibuprofen. Denies HA/SOB/CP/N/V/dizziness. Flatus present. No BM. She reports vaginal bleeding as normal, without clots.  She is ambulating, without assistance. Urinating without difficulty.     Objective:   VS:    Vitals:   07/10/16 0932 07/10/16 1324 07/10/16 1738 07/11/16 0547  BP: 107/61 (!) 100/56 (!) 111/58 110/67  Pulse: (!) 58 60 63 73  Resp: 20 20 16 17   Temp: 98.1 F (36.7 C) 98.4 F (36.9 C) 97.6 F (36.4 C) 98 F (36.7 C)  TempSrc: Oral Oral Oral Oral  SpO2: 100% 99% 98%   Weight:      Height:          Intake/Output Summary (Last 24 hours) at 07/11/16 [id number] Last data filed at 07/10/16 0930  Gross per 24 hour  Intake                0 ml  Output              600 ml  Net             -600 ml         Recent Labs  07/09/16 1306 07/10/16 0631  WBC 9.4 8.6  HGB 12.5 11.4*  HCT 34.6* 31.6*  PLT 129* 109*     Blood type: --/--/A NEG (02/16 1140) / Infant Rh NEG / Rhophylac NOT indicated  Rubella:  Immune    Physical Exam:  General: alert, cooperative and no distress CV: Regular rate and rhythm, S1S2 present or without murmur or extra heart sounds Resp: clear, bilaterally Abdomen: soft, nontender, normal bowel sounds Incision: dry, intact and bloody drainage present on right side of dress, borders marked Uterine Fundus: firm, 2 FB below umbilicus, nontender Lochia: minimal Ext: Homans sign is negative, no sign of DVT and no edema, redness or tenderness in the calves or thighs   Assessment/Plan: 31 y.o.   POD# 2. [id number]         Principal Problem:   Postpartum care following repeat cesarean delivery (2/19)  Encourage rest when baby rests  Breastfeeding support contact lactation consultant if needing further assistance.   Encourage to ambulate  Continue taking ibuprofen for pain and inflammation.   Routine post-op care  Active Problems:   Previous cesarean delivery affecting pregnancy   Rh negative, maternal - Newborn Rh neg  Rhophylac not indicated  Thrombocytopenia, idiopathic   No evidence of hemorrhage  Routine PP care  Doing well, stable.         D/C home today    Kenard GowerDAWSON, Jamir Rone, M, MSN, CNM 07/11/2016 8:35 AM

## 2016-07-11 NOTE — Anesthesia Procedure Notes (Signed)
Spinal  Patient location during procedure: OR Start time: 07/09/2016 2:42 PM Staffing Anesthesiologist: Mal AmabileFOSTER, Anntionette Madkins Performed: anesthesiologist  Preanesthetic Checklist Completed: patient identified, site marked, surgical consent, pre-op evaluation, timeout performed, IV checked, risks and benefits discussed and monitors and equipment checked Spinal Block Patient position: sitting Prep: DuraPrep Patient monitoring: heart rate, cardiac monitor, continuous pulse ox and blood pressure Approach: midline Location: L3-4 Injection technique: single-shot Needle Needle type: Pencan  Needle gauge: 24 G Needle length: 9 cm Needle insertion depth: 5 cm Assessment Sensory level: T4 Additional Notes Patient tolerated procedure well. Adequate sensory level.

## 2016-07-11 NOTE — Lactation Note (Signed)
This note was copied from a baby's chart. Lactation Consultation Note  Patient Name: Paige Stafford: 07/11/2016 Reason for consult: Follow-up assessment;Infant weight loss;Other (Comment) (4% weight loss . mom requesting early discharge ) Bili check at 33 hours 5.8  Baby is 1643 hours old and has been consistent at the breast . Latch score range 12-27-08.  Per mom last fed at 9:30 am for 30 mins.mom reports increased swallows, and milk feels it is coming in . Voids and stools QS.  Sore nipples and engorgement prevention and tx reviewed.  LC instructed mom on the use a hand pump. Per mom with her 1st baby had to increase flange size to #27 and per mom has the #27's at home. Also has  DEBP at home.  LC discussed with mom nutritive vs non - nutritive sucking patterns and the importance for watching for hanging out at the breast.  Per mom my let down is quick . LC recommended if breast are over full on the 1st breast release down over fullness 15 -20 ml , let the baby feed , soften 1st breast well and offer the 2nd breast if still hungry.  Mother informed of post-discharge support and given phone number to the lactation department, including services for phone call assistance; out-patient appointments; and breastfeeding support group. List of other breastfeeding resources in the community given in the handout. Encouraged mother to call for problems or concerns related to breastfeeding.   Maternal Data    Feeding Feeding Type:  (per mom baby last fed at 9:30 for 30 mins, more swallows noted ) Length of feed: 30 min (per mom )  LATCH Score/Interventions Latch: Grasps breast easily, tongue down, lips flanged, rhythmical sucking.  Audible Swallowing: Spontaneous and intermittent  Type of Nipple: Everted at rest and after stimulation  Comfort (Breast/Nipple): Soft / non-tender     Hold (Positioning): No assistance needed to correctly position infant at breast. Intervention(s):  Breastfeeding basics reviewed  LATCH Score: 10  Lactation Tools Discussed/Used Tools: Pump (per mom used #27 flange with her 1st baby and has it ar home with her DEBP ) Breast pump type: Manual   Consult Status Consult Status: Complete Stafford: 07/11/16    Matilde SprangMargaret Ann Dashaun Onstott 07/11/2016, 10:30 AM

## 2016-07-11 NOTE — Progress Notes (Signed)
Discharge education complete, prescription given, discharge instructions and follow up appointment discussed. Patient verbalized understanding. 

## 2018-02-11 IMAGING — US US OB COMP LESS 14 WK
1 series · 15 of 28 positions shown · non-contrast
Comparison: None applicable

CLINICAL DATA: Bleeding since [DATE] a.m.. LMP 10/07/2015. By LMP
patient is 8 weeks 2 days. EDC by LMP is 07/13/2016.

EXAM:
OBSTETRIC <14 WK ULTRASOUND
TECHNIQUE: Transabdominal ultrasound was performed for evaluation of the
gestation as well as the maternal uterus and adnexal regions.

[Series 1: us ob comp less 14 wk · 15 of 31 slices shown]
[im 1/31]
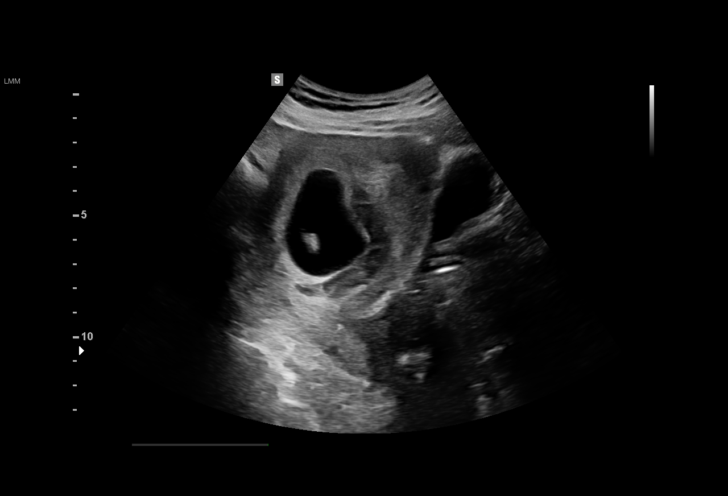
[im 3/31]
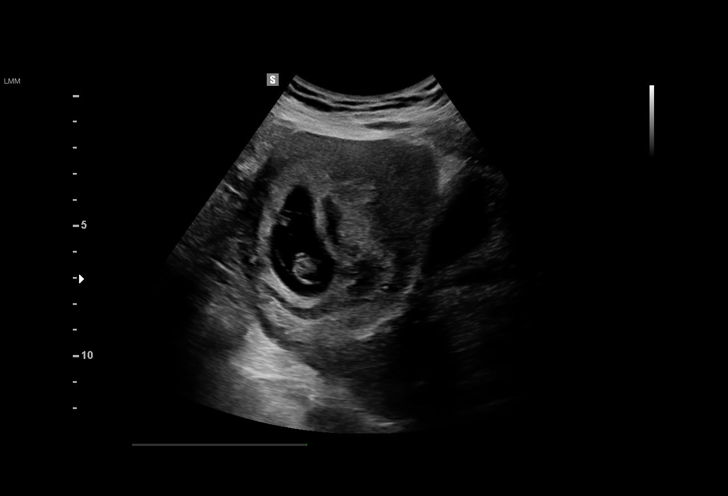
[im 5/31]
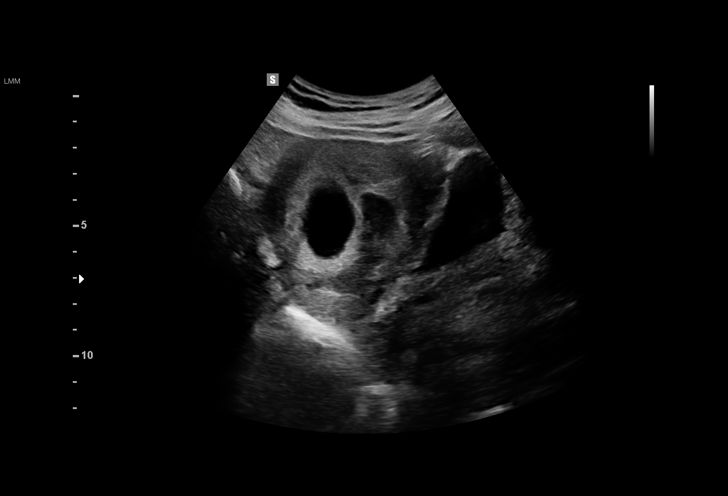
[im 7/31]
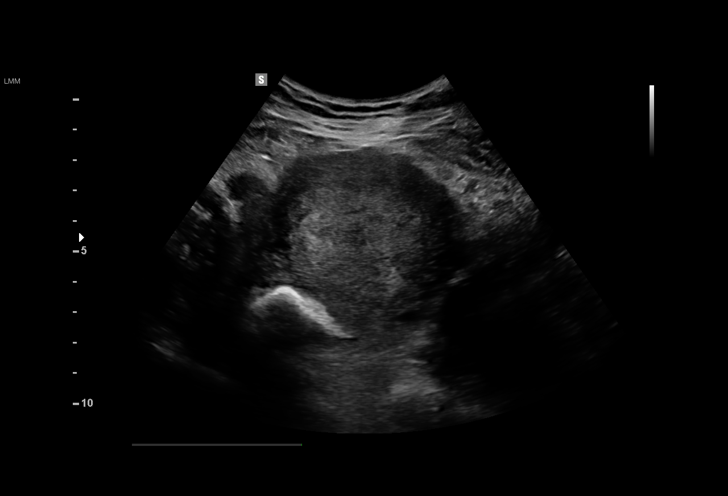
[im 9/31]
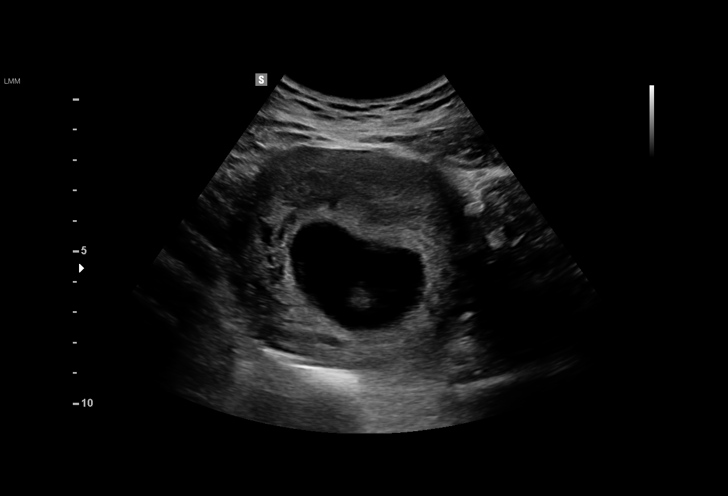
[im 12/31]
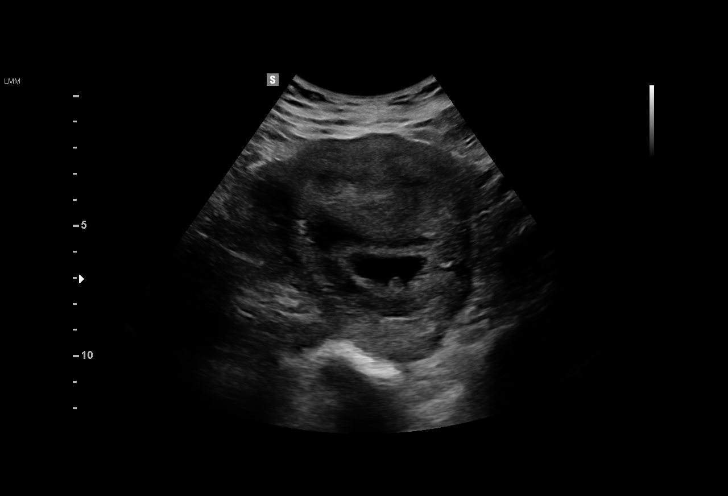
[im 14/31]
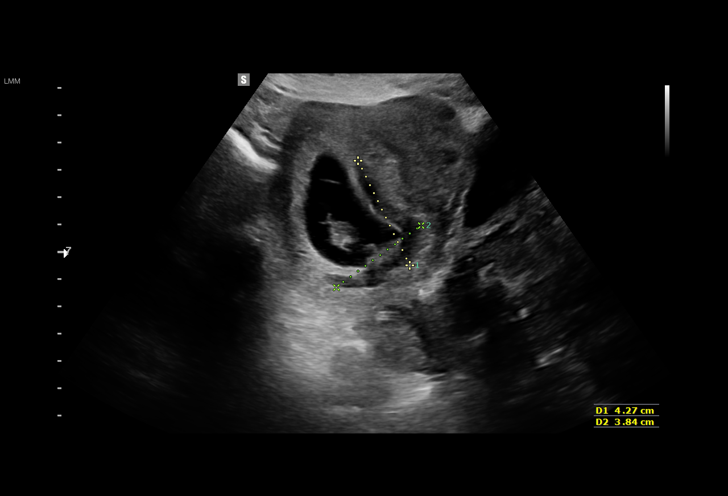
[im 16/31]
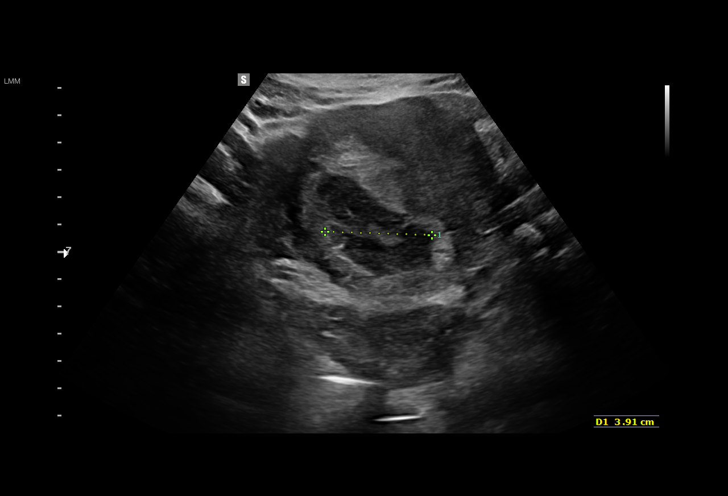
[im 17/31]
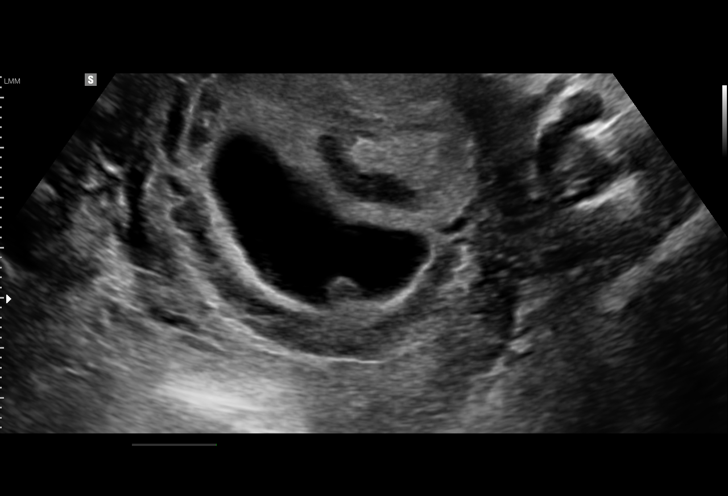
[im 19/31]
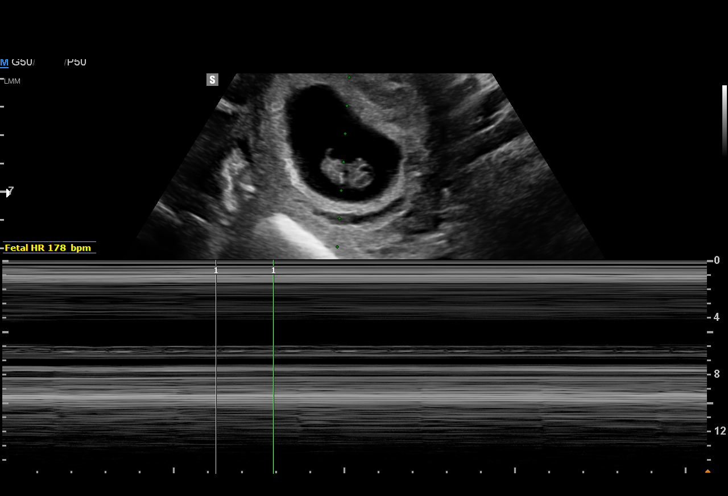
[im 22/31]
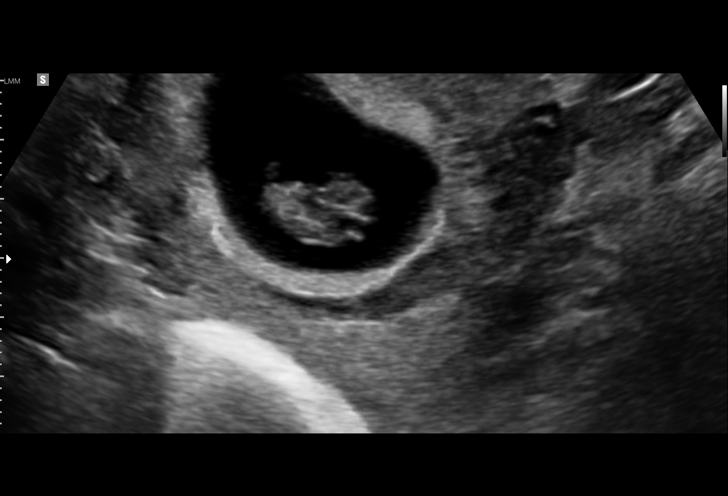
[im 24/31]
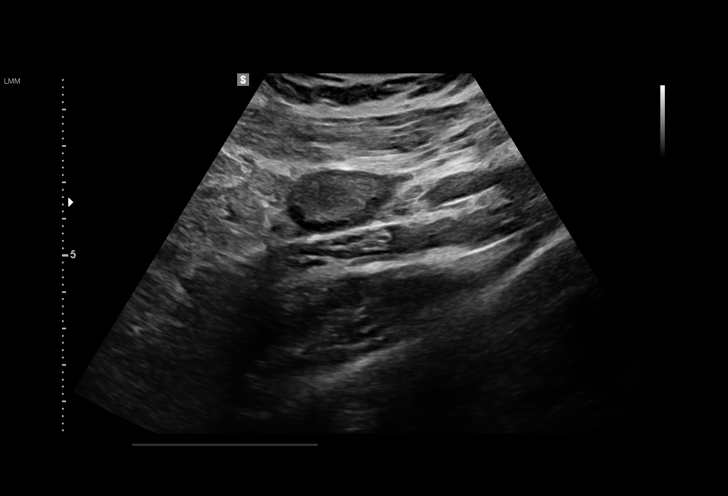
[im 26/31]
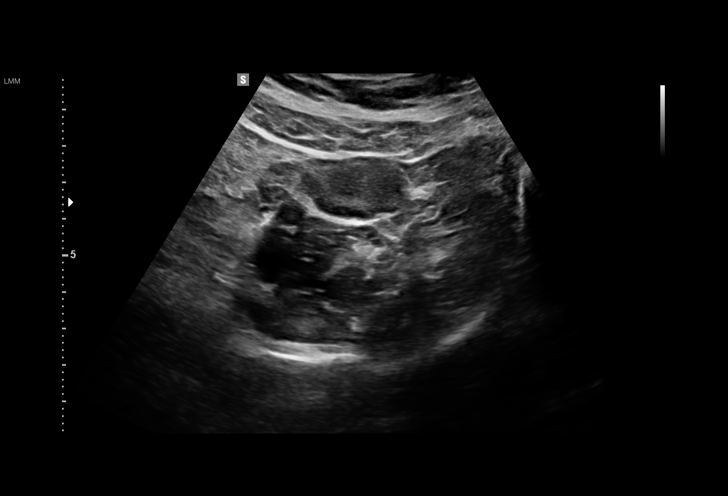
[im 28/31]
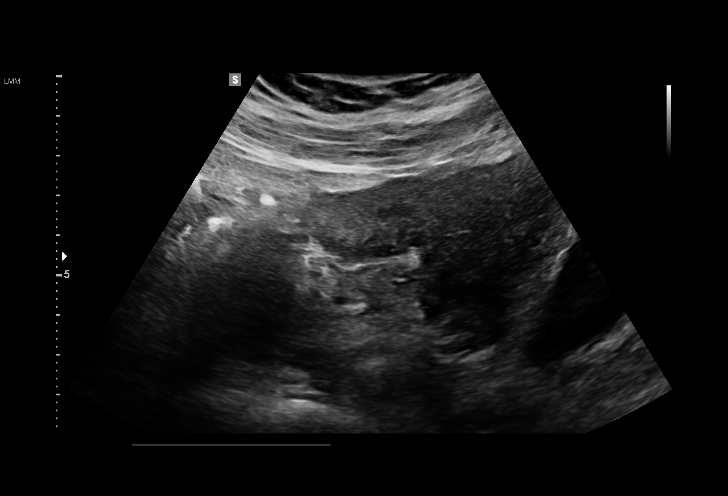
[im 31/31]
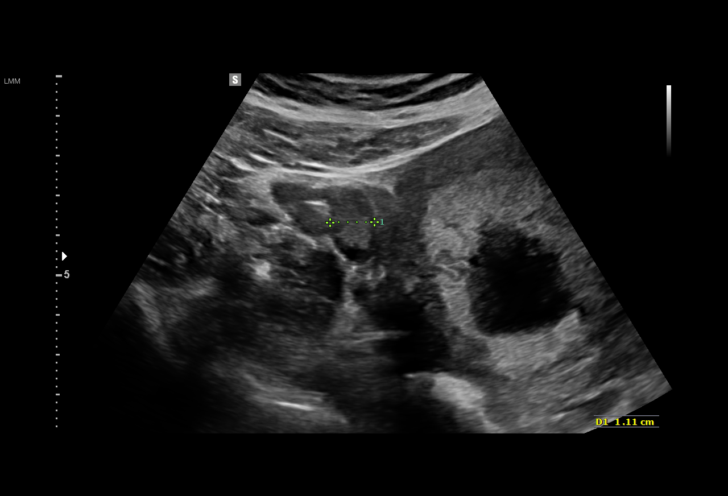

[15 of 28 positions shown; findings below may reference images not displayed]

FINDINGS: Intrauterine gestational sac: Present

Yolk sac:  Present

Embryo:  Present

Cardiac Activity: Present

Heart Rate: 178 bpm

CRL:   20  mm   8 w 3 d                  US EDC: 07/12/2016

Subchorionic hemorrhage: Moderate subchorionic hemorrhage is
present. Measures 4.3 x 3.8 x 3.9 cm.

Maternal uterus/adnexae: Normal appearance of the ovaries. No free
pelvic fluid.
IMPRESSION: Single living intrauterine embryo.

Size and dates correlate well. Today's exam confirms clinical EDC of
07/13/2016.
